# Patient Record
Sex: Female | Born: 1970 | Race: Black or African American | Hispanic: No | Marital: Married | Smoking: Never smoker
Health system: Southern US, Community
[De-identification: ages and names within clinical notes are randomized; demographics above are authoritative.]

## PROBLEM LIST (undated history)

## (undated) DIAGNOSIS — I10 Essential (primary) hypertension: Secondary | ICD-10-CM

## (undated) DIAGNOSIS — E739 Lactose intolerance, unspecified: Secondary | ICD-10-CM

## (undated) DIAGNOSIS — M549 Dorsalgia, unspecified: Secondary | ICD-10-CM

## (undated) DIAGNOSIS — F988 Other specified behavioral and emotional disorders with onset usually occurring in childhood and adolescence: Secondary | ICD-10-CM

## (undated) DIAGNOSIS — M7070 Other bursitis of hip, unspecified hip: Secondary | ICD-10-CM

## (undated) DIAGNOSIS — D509 Iron deficiency anemia, unspecified: Secondary | ICD-10-CM

## (undated) DIAGNOSIS — Z91018 Allergy to other foods: Secondary | ICD-10-CM

## (undated) DIAGNOSIS — R600 Localized edema: Secondary | ICD-10-CM

## (undated) DIAGNOSIS — K829 Disease of gallbladder, unspecified: Secondary | ICD-10-CM

## (undated) DIAGNOSIS — J302 Other seasonal allergic rhinitis: Secondary | ICD-10-CM

## (undated) DIAGNOSIS — E559 Vitamin D deficiency, unspecified: Secondary | ICD-10-CM

## (undated) HISTORY — DX: Disease of gallbladder, unspecified: K82.9

## (undated) HISTORY — DX: Vitamin D deficiency, unspecified: E55.9

## (undated) HISTORY — DX: Lactose intolerance, unspecified: E73.9

## (undated) HISTORY — DX: Localized edema: R60.0

## (undated) HISTORY — DX: Dorsalgia, unspecified: M54.9

## (undated) HISTORY — DX: Other specified behavioral and emotional disorders with onset usually occurring in childhood and adolescence: F98.8

## (undated) HISTORY — DX: Other seasonal allergic rhinitis: J30.2

## (undated) HISTORY — DX: Other bursitis of hip, unspecified hip: M70.70

## (undated) HISTORY — PX: APPENDECTOMY: SHX54

## (undated) HISTORY — DX: Allergy to other foods: Z91.018

---

## 1996-07-18 HISTORY — PX: ECTOPIC PREGNANCY SURGERY: SHX613

## 1997-09-26 ENCOUNTER — Ambulatory Visit (HOSPITAL_COMMUNITY): Admission: RE | Admit: 1997-09-26 | Discharge: 1997-09-26 | Payer: Self-pay | Admitting: Obstetrics and Gynecology

## 1997-10-08 ENCOUNTER — Other Ambulatory Visit: Admission: RE | Admit: 1997-10-08 | Discharge: 1997-10-08 | Payer: Self-pay | Admitting: Obstetrics and Gynecology

## 1997-10-31 ENCOUNTER — Other Ambulatory Visit: Admission: RE | Admit: 1997-10-31 | Discharge: 1997-10-31 | Payer: Self-pay | Admitting: Obstetrics and Gynecology

## 1997-11-22 ENCOUNTER — Inpatient Hospital Stay (HOSPITAL_COMMUNITY): Admission: AD | Admit: 1997-11-22 | Discharge: 1997-11-24 | Payer: Self-pay | Admitting: Obstetrics & Gynecology

## 1998-01-04 ENCOUNTER — Emergency Department (HOSPITAL_COMMUNITY): Admission: EM | Admit: 1998-01-04 | Discharge: 1998-01-04 | Payer: Self-pay | Admitting: Emergency Medicine

## 1998-11-19 ENCOUNTER — Other Ambulatory Visit: Admission: RE | Admit: 1998-11-19 | Discharge: 1998-11-19 | Payer: Self-pay | Admitting: Obstetrics and Gynecology

## 2000-05-18 ENCOUNTER — Other Ambulatory Visit: Admission: RE | Admit: 2000-05-18 | Discharge: 2000-05-18 | Payer: Self-pay | Admitting: Obstetrics and Gynecology

## 2000-09-05 ENCOUNTER — Observation Stay (HOSPITAL_COMMUNITY): Admission: AD | Admit: 2000-09-05 | Discharge: 2000-09-06 | Payer: Self-pay | Admitting: Obstetrics and Gynecology

## 2000-09-06 ENCOUNTER — Encounter: Payer: Self-pay | Admitting: Obstetrics and Gynecology

## 2000-09-20 ENCOUNTER — Emergency Department (HOSPITAL_COMMUNITY): Admission: EM | Admit: 2000-09-20 | Discharge: 2000-09-20 | Payer: Self-pay | Admitting: Emergency Medicine

## 2000-09-27 ENCOUNTER — Encounter: Admission: RE | Admit: 2000-09-27 | Discharge: 2000-10-24 | Payer: Self-pay | Admitting: Orthopedic Surgery

## 2000-11-30 ENCOUNTER — Inpatient Hospital Stay (HOSPITAL_COMMUNITY): Admission: AD | Admit: 2000-11-30 | Discharge: 2000-12-03 | Payer: Self-pay | Admitting: Obstetrics and Gynecology

## 2001-01-31 ENCOUNTER — Encounter: Admission: RE | Admit: 2001-01-31 | Discharge: 2001-02-14 | Payer: Self-pay | Admitting: Orthopedic Surgery

## 2001-02-15 ENCOUNTER — Encounter: Admission: RE | Admit: 2001-02-15 | Discharge: 2001-03-13 | Payer: Self-pay | Admitting: Orthopedic Surgery

## 2002-05-01 ENCOUNTER — Other Ambulatory Visit: Admission: RE | Admit: 2002-05-01 | Discharge: 2002-05-01 | Payer: Self-pay | Admitting: Obstetrics and Gynecology

## 2003-06-12 ENCOUNTER — Inpatient Hospital Stay (HOSPITAL_COMMUNITY): Admission: AD | Admit: 2003-06-12 | Discharge: 2003-06-12 | Payer: Self-pay | Admitting: Obstetrics and Gynecology

## 2004-06-15 ENCOUNTER — Ambulatory Visit (HOSPITAL_COMMUNITY): Admission: RE | Admit: 2004-06-15 | Discharge: 2004-06-15 | Payer: Self-pay | Admitting: Obstetrics and Gynecology

## 2004-06-21 ENCOUNTER — Ambulatory Visit: Payer: Self-pay | Admitting: Internal Medicine

## 2004-09-21 ENCOUNTER — Encounter: Admission: RE | Admit: 2004-09-21 | Discharge: 2004-12-20 | Payer: Self-pay | Admitting: Obstetrics and Gynecology

## 2005-01-28 ENCOUNTER — Inpatient Hospital Stay (HOSPITAL_COMMUNITY): Admission: AD | Admit: 2005-01-28 | Discharge: 2005-01-28 | Payer: Self-pay | Admitting: Obstetrics and Gynecology

## 2005-01-31 ENCOUNTER — Inpatient Hospital Stay (HOSPITAL_COMMUNITY): Admission: AD | Admit: 2005-01-31 | Discharge: 2005-01-31 | Payer: Self-pay | Admitting: Obstetrics & Gynecology

## 2005-02-04 ENCOUNTER — Inpatient Hospital Stay (HOSPITAL_COMMUNITY): Admission: AD | Admit: 2005-02-04 | Discharge: 2005-02-06 | Payer: Self-pay | Admitting: Obstetrics and Gynecology

## 2005-07-15 ENCOUNTER — Encounter: Payer: Self-pay | Admitting: Internal Medicine

## 2005-07-15 LAB — CONVERTED CEMR LAB

## 2005-08-30 ENCOUNTER — Ambulatory Visit: Payer: Self-pay | Admitting: Internal Medicine

## 2006-04-24 ENCOUNTER — Inpatient Hospital Stay (HOSPITAL_COMMUNITY): Admission: AD | Admit: 2006-04-24 | Discharge: 2006-04-24 | Payer: Self-pay | Admitting: Obstetrics and Gynecology

## 2006-05-25 ENCOUNTER — Encounter: Payer: Self-pay | Admitting: Obstetrics and Gynecology

## 2006-05-25 ENCOUNTER — Inpatient Hospital Stay (HOSPITAL_COMMUNITY): Admission: AD | Admit: 2006-05-25 | Discharge: 2006-05-25 | Payer: Self-pay | Admitting: Obstetrics and Gynecology

## 2006-07-21 ENCOUNTER — Inpatient Hospital Stay (HOSPITAL_COMMUNITY): Admission: AD | Admit: 2006-07-21 | Discharge: 2006-07-23 | Payer: Self-pay | Admitting: Obstetrics and Gynecology

## 2006-08-02 ENCOUNTER — Inpatient Hospital Stay (HOSPITAL_COMMUNITY): Admission: AD | Admit: 2006-08-02 | Discharge: 2006-08-02 | Payer: Self-pay | Admitting: Obstetrics & Gynecology

## 2006-08-09 ENCOUNTER — Inpatient Hospital Stay (HOSPITAL_COMMUNITY): Admission: AD | Admit: 2006-08-09 | Discharge: 2006-08-09 | Payer: Self-pay | Admitting: Obstetrics and Gynecology

## 2006-08-11 ENCOUNTER — Inpatient Hospital Stay (HOSPITAL_COMMUNITY): Admission: RE | Admit: 2006-08-11 | Discharge: 2006-08-13 | Payer: Self-pay | Admitting: Obstetrics and Gynecology

## 2006-08-20 ENCOUNTER — Inpatient Hospital Stay (HOSPITAL_COMMUNITY): Admission: AD | Admit: 2006-08-20 | Discharge: 2006-08-23 | Payer: Self-pay | Admitting: Obstetrics and Gynecology

## 2006-08-24 ENCOUNTER — Ambulatory Visit: Payer: Self-pay | Admitting: Internal Medicine

## 2006-09-06 ENCOUNTER — Ambulatory Visit: Payer: Self-pay | Admitting: Internal Medicine

## 2006-09-20 ENCOUNTER — Encounter: Admission: RE | Admit: 2006-09-20 | Discharge: 2006-09-20 | Payer: Self-pay | Admitting: Internal Medicine

## 2006-10-04 ENCOUNTER — Ambulatory Visit: Payer: Self-pay | Admitting: Internal Medicine

## 2007-02-15 ENCOUNTER — Encounter: Payer: Self-pay | Admitting: Internal Medicine

## 2007-02-15 DIAGNOSIS — R519 Headache, unspecified: Secondary | ICD-10-CM | POA: Insufficient documentation

## 2007-02-15 DIAGNOSIS — I10 Essential (primary) hypertension: Secondary | ICD-10-CM

## 2007-02-15 DIAGNOSIS — R51 Headache: Secondary | ICD-10-CM

## 2007-07-19 HISTORY — PX: TUBAL LIGATION: SHX77

## 2007-08-03 ENCOUNTER — Inpatient Hospital Stay (HOSPITAL_COMMUNITY): Admission: AD | Admit: 2007-08-03 | Discharge: 2007-08-04 | Payer: Self-pay | Admitting: Obstetrics and Gynecology

## 2007-08-24 ENCOUNTER — Ambulatory Visit (HOSPITAL_COMMUNITY): Admission: RE | Admit: 2007-08-24 | Discharge: 2007-08-24 | Payer: Self-pay | Admitting: Obstetrics and Gynecology

## 2007-11-17 ENCOUNTER — Ambulatory Visit (HOSPITAL_COMMUNITY): Admission: RE | Admit: 2007-11-17 | Discharge: 2007-11-17 | Payer: Self-pay | Admitting: Obstetrics and Gynecology

## 2008-01-21 ENCOUNTER — Inpatient Hospital Stay (HOSPITAL_COMMUNITY): Admission: AD | Admit: 2008-01-21 | Discharge: 2008-01-24 | Payer: Self-pay | Admitting: *Deleted

## 2008-01-22 ENCOUNTER — Encounter (INDEPENDENT_AMBULATORY_CARE_PROVIDER_SITE_OTHER): Payer: Self-pay | Admitting: Obstetrics and Gynecology

## 2008-05-21 ENCOUNTER — Ambulatory Visit: Payer: Self-pay | Admitting: Internal Medicine

## 2008-08-09 ENCOUNTER — Ambulatory Visit: Payer: Self-pay | Admitting: Family Medicine

## 2008-08-09 DIAGNOSIS — J1189 Influenza due to unidentified influenza virus with other manifestations: Secondary | ICD-10-CM

## 2008-09-03 ENCOUNTER — Encounter (INDEPENDENT_AMBULATORY_CARE_PROVIDER_SITE_OTHER): Payer: Self-pay | Admitting: *Deleted

## 2009-02-05 ENCOUNTER — Telehealth: Payer: Self-pay | Admitting: Family Medicine

## 2009-02-12 ENCOUNTER — Telehealth: Payer: Self-pay | Admitting: Internal Medicine

## 2009-02-12 ENCOUNTER — Ambulatory Visit: Payer: Self-pay | Admitting: Internal Medicine

## 2009-02-12 DIAGNOSIS — M722 Plantar fascial fibromatosis: Secondary | ICD-10-CM

## 2009-03-11 LAB — CONVERTED CEMR LAB
BUN: 12 mg/dL (ref 6–23)
Basophils Absolute: 0 10*3/uL (ref 0.0–0.1)
CO2: 25 meq/L (ref 19–32)
Eosinophils Absolute: 0.1 10*3/uL (ref 0.0–0.7)
HCT: 34.7 % — ABNORMAL LOW (ref 36.0–46.0)
Monocytes Relative: 9 % (ref 3–12)
Neutrophils Relative %: 45 % (ref 43–77)
Platelets: 232 10*3/uL (ref 150–400)
Sodium: 140 meq/L (ref 135–145)
TSH: 1.026 microintl units/mL (ref 0.350–4.500)

## 2009-05-28 ENCOUNTER — Ambulatory Visit: Payer: Self-pay | Admitting: Internal Medicine

## 2009-05-28 LAB — CONVERTED CEMR LAB
BUN: 9 mg/dL (ref 6–23)
Basophils Absolute: 0 10*3/uL (ref 0.0–0.1)
CO2: 31 meq/L (ref 19–32)
Chloride: 102 meq/L (ref 96–112)
Creatinine, Ser: 0.9 mg/dL (ref 0.4–1.2)
Eosinophils Absolute: 0.1 10*3/uL (ref 0.0–0.7)
Eosinophils Relative: 3 % (ref 0.0–5.0)
GFR calc non Af Amer: 90.36 mL/min (ref >60)
Glucose, Bld: 58 mg/dL — ABNORMAL LOW (ref 70–99)
HCT: 36.2 % (ref 36.0–46.0)
HDL: 48.8 mg/dL (ref >39.00)
Hemoglobin: 12 g/dL (ref 12.0–15.0)
LDL Cholesterol: 104 mg/dL — ABNORMAL HIGH (ref 0–99)
Lymphs Abs: 2 10*3/uL (ref 0.7–4.0)
MCV: 92.7 fL (ref 78.0–100.0)
Monocytes Absolute: 0.3 10*3/uL (ref 0.1–1.0)
Platelets: 241 10*3/uL (ref 150.0–400.0)
Potassium: 2.9 meq/L — ABNORMAL LOW (ref 3.5–5.1)
RDW: 13.5 % (ref 11.5–14.6)
Sodium: 141 meq/L (ref 135–145)
Total CHOL/HDL Ratio: 3
Triglycerides: 63 mg/dL (ref 0.0–149.0)
WBC: 4.5 10*3/uL (ref 4.5–10.5)

## 2009-05-31 ENCOUNTER — Telehealth: Payer: Self-pay | Admitting: Internal Medicine

## 2009-06-03 ENCOUNTER — Ambulatory Visit: Payer: Self-pay | Admitting: Radiology

## 2009-06-03 ENCOUNTER — Ambulatory Visit: Payer: Self-pay | Admitting: Internal Medicine

## 2009-06-03 ENCOUNTER — Telehealth: Payer: Self-pay | Admitting: Internal Medicine

## 2009-06-03 ENCOUNTER — Ambulatory Visit (HOSPITAL_BASED_OUTPATIENT_CLINIC_OR_DEPARTMENT_OTHER): Admission: RE | Admit: 2009-06-03 | Discharge: 2009-06-03 | Payer: Self-pay | Admitting: Internal Medicine

## 2009-06-03 DIAGNOSIS — M549 Dorsalgia, unspecified: Secondary | ICD-10-CM | POA: Insufficient documentation

## 2009-06-03 DIAGNOSIS — E876 Hypokalemia: Secondary | ICD-10-CM | POA: Insufficient documentation

## 2009-06-24 ENCOUNTER — Telehealth: Payer: Self-pay | Admitting: Internal Medicine

## 2009-07-29 ENCOUNTER — Telehealth: Payer: Self-pay | Admitting: Internal Medicine

## 2009-07-29 ENCOUNTER — Ambulatory Visit: Payer: Self-pay | Admitting: Internal Medicine

## 2009-07-29 DIAGNOSIS — E663 Overweight: Secondary | ICD-10-CM | POA: Insufficient documentation

## 2009-07-29 DIAGNOSIS — R358 Other polyuria: Secondary | ICD-10-CM

## 2009-07-29 LAB — CONVERTED CEMR LAB
BUN: 11 mg/dL (ref 6–23)
Blood Glucose, Fingerstick: 83
Blood in Urine, dipstick: NEGATIVE
Chloride: 105 meq/L (ref 96–112)
Creatinine, Ser: 0.73 mg/dL (ref 0.40–1.20)
Glucose, Bld: 76 mg/dL (ref 70–99)
Osmolality: 291 mOsm/kg (ref 275–300)
Protein, U semiquant: NEGATIVE
Sodium: 138 meq/L (ref 135–145)
pH: 6.5

## 2009-07-30 ENCOUNTER — Encounter: Payer: Self-pay | Admitting: Internal Medicine

## 2009-07-30 ENCOUNTER — Telehealth: Payer: Self-pay | Admitting: Internal Medicine

## 2009-08-14 ENCOUNTER — Telehealth: Payer: Self-pay | Admitting: Internal Medicine

## 2009-09-02 ENCOUNTER — Encounter: Payer: Self-pay | Admitting: Internal Medicine

## 2009-09-04 ENCOUNTER — Encounter: Payer: Self-pay | Admitting: Internal Medicine

## 2009-09-17 ENCOUNTER — Encounter: Payer: Self-pay | Admitting: Internal Medicine

## 2009-09-22 ENCOUNTER — Encounter: Admission: RE | Admit: 2009-09-22 | Discharge: 2009-09-22 | Payer: Self-pay | Admitting: Nephrology

## 2009-10-07 ENCOUNTER — Encounter: Payer: Self-pay | Admitting: Internal Medicine

## 2009-12-15 ENCOUNTER — Encounter: Admission: RE | Admit: 2009-12-15 | Discharge: 2009-12-15 | Payer: Self-pay | Admitting: Nephrology

## 2009-12-18 ENCOUNTER — Encounter: Payer: Self-pay | Admitting: Internal Medicine

## 2010-04-29 ENCOUNTER — Encounter: Payer: Self-pay | Admitting: Internal Medicine

## 2010-08-19 NOTE — Progress Notes (Signed)
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  I suggest referral to nephrologist Initial call taken by: D. Thomos Lemons DO,  July 30, 2009 6:13 PM  Follow-up for Phone Call        Left message with daughter to return call        Referral request fax to [facility] Follow-up by: Darral Dash,  July 31, 2009 10:48 AM  Additional Follow-up for Phone Call Additional follow up Details #1::        Spoke with pt . wants to talk with Nurse. please call  [phone]    pt also need rx for Bystolic 5mg  call to pharmacy   Additional Follow-up by: Darral Dash,  August 12, 2009 9:27 AM    Additional Follow-up for Phone Call Additional follow up Details #2::    attempted to contact patient at [phone], no answer voice message left informing patient rx has been sent to pharmacy Follow-up by: Glendell Docker CMA,  August 12, 2009 4:05 PM  Prescriptions: BYSTOLIC 5 MG TABS (NEBIVOLOL HCL) one by mouth once daily  #30 x 5   Entered by:   Glendell Docker CMA   Authorized by:   D. Thomos Lemons DO   Signed by:   Glendell Docker CMA on 08/12/2009   Method used:   Electronically to        [facility] [id number] (retail)       [address] [place], [place]  [address]       Ph: [phone]       Fax: [phone]   RxID:   [id number]

## 2010-08-19 NOTE — Assessment & Plan Note (Signed)
Summary: 2 mon f/u/hea   Vital Signs:  Patient profile:   40 year old female Weight:      145.50 pounds BMI:     27.59 O2 Sat:      100 % on Room air Temp:     98.1 degrees F oral Pulse rate:   65 / minute Pulse rhythm:   regular Resp:     16 per minute BP sitting:   130 / 90  (right arm) Cuff size:   regular  Vitals Entered By: Glendell Docker CMA (July 29, 2009 10:34 AM)  O2 Flow:  Room air  Primary Care Provider:  D. Thomos Lemons DO  CC:  2 Month follow up .  History of Present Illness: 2 Month follow up  c/o frequent urination for several days no dysuria,  no unusual odor to urine no vaginal symptoms.  denies excessive thirst.  previous BMET shows slight hypoglycemia and low K.  Allergies (verified): No Known Drug Allergies  Past History:  Past Medical History: Hypertension Headache       Family History: Hypertension     Social History: Married 6 children Never Smoked  Alcohol use-no    Review of Systems       she is concerned about wt gain.  she is eating healthy but not restricting calories.  Physical Exam  General:  alert, well-developed, and well-nourished.   Lungs:  normal respiratory effort and normal breath sounds.   Heart:  normal rate, regular rhythm, and no gallop.   Abdomen:  soft.  mild suprapubic tenderness Extremities:  No lower extremity edema    Impression & Recommendations:  Problem # 1:  POLYURIA (ION-629.52) 40 y/o AA with complaint of polyuria.  urine is neg but she has mild bladder discomfort with palpation.  I suspect early UTI.  empiric abx. blood sugar normal.  rule out diabetes insipidus  Orders: T-Basic Metabolic Panel [phone]) T- * Misc. Laboratory test [phone]) T- * Misc. Laboratory test [phone]) T-Culture, Urine ([phone]) Glucose, (CBG) ([phone])  Problem # 2:  HYPERTENSION (ICD-401.9) stable.  she has hypokalemia.  consider hyperaldosteronism.  repeat labs.  If persistent abnormality - refer to  nephrologist Her updated medication list for this problem includes:    Bystolic 5 Mg Tabs (Nebivolol hcl) ..... One by mouth once daily  BP today: 130/90 Prior BP: 110/80 (06/03/2009)  Labs Reviewed: K+: 2.9 (05/28/2009) Creat: : 0.9 (05/28/2009)   Chol: 165 (05/28/2009)   HDL: 48.80 (05/28/2009)   LDL: 104 (05/28/2009)   TG: 63.0 (05/28/2009)  Problem # 3:  OVERWEIGHT (ICD-278.02) discussed wt loss strategis.  limit calorie intake to 1200-1400 cal per day.  follow low carb diet.  previous thyroid studies normal  Ht: 61 (02/12/2009)   Wt: 145.50 (07/29/2009)   BMI: 27.59 (07/29/2009)  Complete Medication List: 1)  Bystolic 5 Mg Tabs (Nebivolol hcl) .... One by mouth once daily 2)  Cefuroxime Axetil 500 Mg Tabs (Cefuroxime axetil) .... One by mouth two times a day  Patient Instructions: 1)  Call our office if your symptoms do not  improve or gets worse. Prescriptions: CEFUROXIME AXETIL 500 MG TABS (CEFUROXIME AXETIL) one by mouth two times a day  #10 x 0   Entered and Authorized by:   D. Thomos Lemons DO   Signed by:   D. Thomos Lemons DO on 07/29/2009   Method used:   Electronically to        [facility] [id number] (retail)  [address]       [place], [place]  [address]       Ph: [phone]       Fax: [phone]   RxID:   [id number]   Current Allergies (reviewed today): No known allergies   Laboratory Results   Urine Tests    Routine Urinalysis   Color: yellow Appearance: Clear Glucose: negative   (Normal Range: Negative) Bilirubin: negative   (Normal Range: Negative) Ketone: negative   (Normal Range: Negative) Spec. Gravity: <1.005   (Normal Range: 1.003-1.035) Blood: negative   (Normal Range: Negative) pH: 6.5   (Normal Range: 5.0-8.0) Protein: negative   (Normal Range: Negative) Urobilinogen: 0.2   (Normal Range: 0-1) Nitrite: negative   (Normal Range: Negative) Leukocyte Esterace: negative   (Normal Range: Negative)      Blood Tests     CBG Random:: 83mg /dL

## 2010-08-19 NOTE — Letter (Signed)
Summary: Triage Call Report/Call a Nurse  Triage Call Report/Call a Nurse   Imported By: Lanelle Bal 08/03/2009 12:45:21  _____________________________________________________________________  External Attachment:    Type:   Image     Comment:   External Document

## 2010-08-19 NOTE — Consult Note (Signed)
Summary: [facility]  [facility]   Imported By: Lanelle Bal 09/30/2009 12:33:10  _____________________________________________________________________  External Attachment:    Type:   Image     Comment:   External Document

## 2010-08-19 NOTE — Progress Notes (Signed)
Summary: wants to talk to Dr. Fonnie Birkenhead: Abnormal Urine  Phone Note Call from Patient   Caller: Patient Details for Reason: pt. has questions [place]:HCWCBJSE labs Summary of Call: Pt. is waiting to hear back from doctor or nurse Re: abnormal labs. She has some questions. Call back [phone].Michaelle Copas  August 14, 2009 3:28 PM   Follow-up for Phone Call        [phone] cell phone - please call pt on monday to arrange referral to nephrologist Follow-up by: D. Thomos Lemons DO,  August 14, 2009 5:01 PM  Additional Follow-up for Phone Call Additional follow up Details #1::        Left message for pt to return call    Additional Follow-up by: Darral Dash,  August 18, 2009 2:21 PM    Additional Follow-up for Phone Call Additional follow up Details #2::    Spoke with patient   referral sent to [facility]   Jan 14    waiting to hear back for them  pt informed Follow-up by: Darral Dash,  August 19, 2009 8:48 AM

## 2010-08-19 NOTE — Letter (Signed)
Summary: [facility]  [facility]   Imported By: Maryln Gottron 05/14/2010 14:40:07  _____________________________________________________________________  External Attachment:    Type:   Image     Comment:   External Document

## 2010-08-19 NOTE — Letter (Signed)
Summary: [facility]  [facility]   Imported By: Lanelle Bal 10/23/2009 08:50:33  _____________________________________________________________________  External Attachment:    Type:   Image     Comment:   External Document

## 2010-08-19 NOTE — Letter (Signed)
Summary: [facility]  [facility]   Imported By: Lanelle Bal 01/06/2010 10:50:44  _____________________________________________________________________  External Attachment:    Type:   Image     Comment:   External Document
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
## 2010-11-30 NOTE — Op Note (Signed)
Lindsay [name], Lindsay [name]            ACCOUNT NO.:  [id number]   MEDICAL RECORD NO.:  [id number]          PATIENT TYPE:  INP   LOCATION:  9125                          [facility]ACILITY:  [facility]   PHYSICIAN:  Maxie Better, M.D.DATE OF BIRTH:  13-Apr-1971   DATE OF PROCEDURE:  01/22/2008  DATE OF DISCHARGE:                               OPERATIVE REPORT   PREOPERATIVE DIAGNOSIS:  Desires sterilization.   PROCEDURE:  1. Minilaparotomy.  2. Postpartum tubal ligation via right partial salpingectomy.   POSTOPERATIVE DIAGNOSES:  1. Desires sterilization.  2. Surgical absence of left fallopian tube.   ANESTHESIA:  Epidural.   SURGEON:  Maxie Better, MD   INDICATIONS:  This is a 39 year old gravida 8, para 6 female status post  uncomplicated vaginal delivery on January 22, 2008, who desires permanent  sterilization.  This last pregnancy was secondary to a failed vasectomy.  The patient has a history of an ectopic pregnancy with reportedly  presence of the left fallopian tube and the operative report was  unavailable since the surgery had been somewhere between 1997 and 1998.  Risk and benefit of the procedure and plan had been explained to the  patient.  Consent was signed and the patient was transferred to the  operating room.   PROCEDURE:  Under adequate epidural anesthesia, the patient was placed  in the supine position.  An indwelling Foley catheter was not in place.  The abdomen was sterilely prepped and draped in usual fashion.  Marcaine  0.25% was injected along the previous infraumbilical incision.  The  infraumbilical incision was carried down to the rectus fascia which was  then opened and extended.  The uterus was at the umbilicus.  Left side  was inspected.  The round ligaments were identified down to its  insertion into the left lower quadrant.  The left ovary was brought up  into the field and fallopian tube was not identified, was not palpated,  several attempts and  inspection did not reveal the presence of the tube.  The patient was notified of that intraoperatively.  Attention was then  turned to the right side with normal palpable right ovary and the right  fallopian tube was easily identified and brought up to its fimbriated  end.  Midportion of the  tube was grasped with a Babcock.  The  underlying mesosalpinx was opened with cautery and the proximal distal  portion was tied with 0 chromic suture x2 proximally and distally and  the intervening segment was then removed.  The fallopian tubes brought  back into the abdomen.  Bleeding from the left ovary in its manipulation  and try to look for the left fallopian tube and this was either  hemostased with sutures and/or clamped and free tied with 3-0 Vicryl  suture with good hemostasis noted.  When the procedure was felt to be  complete, the  fascia was closed with 0 Vicryl.  The skin approximated with 4-0 Vicryl  subcuticular stitch.  Specimen of this portion of the right fallopian  tube was sent to pathology.  Estimated blood loss was minimal.  Complication was none.  The  patient tolerated the procedure well and was  transferred to recovery in stable condition.      Maxie Better, M.D.  Electronically Signed     [place]/MEDQ  D:  01/22/2008  T:  01/23/2008  Job:  [id number]

## 2010-12-03 NOTE — Consult Note (Signed)
Lindsay [name], Lindsay [name]            ACCOUNT NO.:  [id number]   MEDICAL RECORD NO.:  [id number]          PATIENT TYPE:  MAT   LOCATION:  MATC                          [facility]ILITY:  [facility]   PHYSICIAN:  Richardean Sale, M.D.   DATE OF BIRTH:  [birth year]   DATE OF CONSULTATION:  08/20/2006  DATE OF DISCHARGE:                                 CONSULTATION   CHIEF COMPLAINT:  Headache.   HISTORY OF PRESENT ILLNESS:  This is a 40 year old gravida 7, para 5-0-2-  5 African American female, who is status post vaginal delivery on  August 11, 2006, who has been on oral labetalol since delivery for  control of hypertension.  The patient says she does have a history of  hypertension in the past that was treated with medication, but improved  and she was not on any medicine throughout this pregnancy.  She has been  having a headache over the last couple days that has been behind her  eyes, 5/10 in severity.  Yesterday, she took 800 mg ibuprofen with  improvement in the headache but the headache returned today.  She  complains of some visual changes earlier today but that resolved when  she put on her glasses.  She denies any nausea, vomiting or epigastric  pain.   PAST HISTORY:  Significant for intermittent hypertension not on  medication at time of this pregnancy, vaginal delivery x5, ectopic x1,  SAB x1.  No prior surgeries.  No known drug allergies.   FAMILY HISTORY:  Positive for mom with hypertension.   MEDICATIONS:  Labetalol 100 mg p.o. t.i.d. and Tylenol and ibuprofen  p.r.n.   PHYSICAL EXAM:  She is afebrile.  Vital signs are stable.  Blood  pressure on arrival was 151/104.  After receiving a dose of labetalol,  it was down to 147/79.  GENERAL:  She is a well-developed and well-nourished [place] American  female who is no acute distress.  HEART:  Regular rate and rhythm.  LUNGS:  Clear to auscultation bilaterally.  ABDOMEN:  Is soft, nontender, no epigastric pain.  EXTREMITIES:  No  cyanosis, clubbing or edema, nontender.  Deep tendon  reflexes are 2+ bilaterally with no clonus.   LABORATORY STUDIES:  Urine is negative for protein, LDH 192, uric acid  5.1, ALT 29, AST 22, creatinine 0.8, platelets 369, hemoglobin 1.6.   ASSESSMENT:  A 35 gravida 7, para 5-0-2-5 [place] American female who is  approximately one week status post delivery with hypertension.   PLAN:  1. The patient received additional dose labetalol 100 mg p.o. here at      the hospital and 650 mg of Tylenol after which her headache      resolved completely.  She now denies any headache, visual changes      or epigastric pain and her blood pressure is improved.  Given that      she is now asymptomatic and her preeclampsia labs are normal, we      will send her home on bedrest to continue her labetalol 100 mg p.o.      t.i.d.  She is to return to  the office      tomorrow for a blood pressure check to determine if we may need to      make any adjustments in her blood pressure medications.  She is to      return immediately if her headache recurs and has not improved with      Tylenol, ibuprofen or she develops visual changes or epigastric      pain.      Richardean Sale, M.D.  Electronically Signed     JW/MEDQ  D:  08/20/2006  T:  08/20/2006  Job:  [id number]
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
## 2010-12-03 NOTE — H&P (Signed)
[name], [name]            ACCOUNT NO.:  [id number]   MEDICAL RECORD NO.:  [id number]          PATIENT TYPE:  INP   LOCATION:  9152                          [facility]ILITY:  [facility]   PHYSICIAN:  Lenoard Aden, M.D.DATE OF BIRTH:  16-May-1971   DATE OF ADMISSION:  07/21/2006  DATE OF DISCHARGE:                              HISTORY & PHYSICAL   CHIEF COMPLAINT:  1. IGR.  2. Oligohydramnios.   This is a 40 year old African American female G7, P4,0-2-4, who presents  with estimated fetal weight in the 4th percentile and an AFI of 6 today  for hospitalization and expectant management, IV fluids, and fetal  surveillance.   ALLERGIES:  No known drug allergies.   MEDICATIONS:  Prenatal vitamins and Labetalol.   PAST MEDICAL HISTORY:  She is a nonsmoker, nondrinker.  She denies  domestic or physical violence.  She has a history of pregnancy, 4  vaginal deliveries, a miscarriage, a history of hypertension, previously  an appendectomy.   FAMILY HISTORY:  Cleft palate and hypertension.   Prenatal course is complicated by chronic hypertension on labetalol,  IGR, and oligohydramnios as noted.   PHYSICAL EXAMINATION:  VITAL SIGNS:  Her blood pressure is 110/72.  HEENT:  Normal.  NECK:  Supple with full range of motion.  LUNGS:  Clear to auscultation.  HEART:  Regular rate and rhythm.  ABDOMEN:  Soft, gravid, nontender.  Estimated fetal weight per  ultrasound in the 4th percentile.  PELVIC:  Cervix is 2, 80%, 0.  EXTREMITIES:  No cords.  NEUROLOGIC:  Nonfocal.  SKIN:  Intact.   IMPRESSION:  A 36 week intrauterine pregnancy.  Chronic hypertension with inhibitory growth rate (IGR) and  oligohydramnios.  History of normal Dopplers.   PLAN:  Proceed with hospitalization, IV fluid management, continue her  labetalol, fetal surveillance, possible expectant management and  discharge versus delivery.      Lenoard Aden, M.D.  Electronically Signed     RJT/MEDQ  D:   07/22/2006  T:  07/22/2006  Job:  [id number]

## 2010-12-03 NOTE — H&P (Signed)
[facility]  Patient:    Lindsay [name], Lindsay [name]                     MRN: [id number] Adm. Date:  [id number] Disc. Date: [id number] Attending:  Tobey Bride Dictator:   Vance Gather Duplantis, C.N.M.                         History and Physical  HISTORY OF PRESENT ILLNESS:   Lindsay [name] is a 40 year old married black female, gravida 4, para 2-0-1-2 at [redacted] weeks gestation who presents complaining of uterine contractions every 4 to 5 minutes for the last couple of hours. She denies any nausea, vomiting, leaking, or vaginal bleeding.  She reports positive fetal movement.  Her pregnancy has been followed at [facility] by the certified nurse midwife service and has been essentially uncomplicated though at risk for 1) first trimester spotting, 2) questionable LMP, 3) history of cryosurgery, 4) history of gestational diabetes with a previous pregnancy, 5) family history of polydactyly.  OB-GYN HISTORY:               She is a gravida 4, para 2-0-0-1 ectopic and two live births with a questionable LMP with EDC by ultrasound of 12/07/00.  She delivered a viable female infant in March of 1996 that weighed 6 pounds, 8 ounces at [redacted] weeks gestation following a three-hour labor with no complications.  In May 1997, she had a left ectopic pregnancy and, in May 1999, she delivered a viable female infant who weighed 7 pounds, 14 ounces at [redacted] weeks gestation following a two-hour labor also with no complications, but she did have gestational diabetes with that pregnancy.  GENERAL MEDICAL HISTORY:      She has no known drug allergies.  She reports having had the usual childhood diseases.  She reports a history of Chlamydia in 1993 that has been treated and no subsequent problems.  She has a history of anemia after her delivery in 1996, a history of diabetes with her pregnancy in 1999, and occasional urinary tract infections.  FAMILY HISTORY:               Significant for  a father and sister with chronic hypertension, maternal uncle with cancer of unknown type, and her daughter had polydactyly and multiple other family members with polydactyly.  SOCIAL HISTORY:               She is married to [employer] who is involved and supportive.  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  They are of the [place] faith.  They deny any illicit drug use, alcohol, or smoking with this pregnancy.  PRENATAL LABS:                Her blood type is O positive.  Her antibody screen is negative.  Sickle cell trait is negative.  Syphilis is nonreactive. Rubella is immune.  Hepatitis B surface antigen is negative.  HIV is nonreactive.  Glucola was 87 at 18 weeks and 128 at 28 weeks.  Her GC and Chlamydia were both negative.  Pap was within normal limits, and her 36-week beta strep was negative.  PHYSICAL EXAMINATION:  VITAL SIGNS:                  Stable.  She is afebrile.  HEENT:  Grossly within normal limits.  HEART:                        Regular rhythm and rate.  CHEST:                        Clear.  BREASTS:                      Soft and nontender.  ABDOMEN:                      Gravid with uterine contractions every three to four minutes.  Her fetal heart rate is reactive and reassuring.  PELVIC:                       Exam is 5 to 6 cm, 90% vertex, and intact membranes per the R.N. and maternity admissions.  EXTREMITIES:                  Within normal limits.  ASSESSMENT:                   1. Intrauterine pregnancy at term.                               2. Active labor.                               3. Negative group B strep.  PLAN:                         Admit to labor and delivery, follow routine certified nurse midwife orders, and to notify Dr. Leonard Schwartz of patients admission. DD:  11/30/00 TD:  11/30/00 Job: [id number]

## 2010-12-03 NOTE — Discharge Summary (Signed)
Lindsay [name], Lindsay [name]            ACCOUNT NO.:  [id number]   MEDICAL RECORD NO.:  [id number]          PATIENT TYPE:  INP   LOCATION:  9125                          FACILITY:  [facility]   PHYSICIAN:  Maxie Better, M.D.DATE OF BIRTH:  1971-07-17   DATE OF ADMISSION:  01/21/2008  DATE OF DISCHARGE:  01/24/2008                               DISCHARGE SUMMARY   ADMISSION DIAGNOSES:  1. Term gestation.  2. Active labor, desires sterilization.  3. Chronic hypertension.   DISCHARGE DIAGNOSES:  1. Chronic hypertension.  2. Desires sterilization.  3. Term gestation, delivered.   PROCEDURE:  Postpartum tubal ligation.   HISTORY OF PRESENT ILLNESS:  A 40 year old gravida 8, para 5-0-2-5  female at term with chronic hypertension on labetalol, well controlled,  admitted in active labor.  The patient's husband had had a vasectomy,  which failed.  She has a history of an ectopic pregnancy in May 1998.   HOSPITAL COURSE:  The patient was admitted.  She went into spontaneous  labor and delivered quickly.  The patient desired sterilization.  She  had had reported removal of tube at the time of her ectopic.  The  patient reports that was not true.  The report had not been immediately  available.  After her delivery, the patient was taken to the operating  room for a surgery.  Surgical absent of the left tube was noted.  Right  midportion of fallopian tube was removed.  The patient had delivered a 7-  pound 1 ounce live female with Apgar of 8 and 9.  Postoperatively, the  patient was continued on her labetalol.  By postop day #2, postpartum  day #2, the patient other than complaining of incisional pain from her  tubal ligation was doing well.  Her blood pressures were 153/92, range  of blood pressure was 138-152/88-102.  Her infraumbilical incision had  no erythema or drainage.  She was deemed well to be discharge to home.   DISPOSITION:  Home.   CONDITION:  Stable.   DISCHARGE  MEDICATIONS:  1. Labetalol 200 mg p.o. b.i.d.  2. Vitamins 1 p.o. daily.   DISCHARGE INSTRUCTIONS:  Per the postpartum booklet given.  Followup  appointment for her blood pressure check at [facility] on Monday  and for 6 weeks postpartum.   The CBC on postop day #1, postpartum day #1 was hemoglobin of 12,  hematocrit 35.4, white count of 8.1, and platelet count of 158,000.      Maxie Better, M.D.  Electronically Signed     [place]/MEDQ  D:  02/07/2008  T:  02/07/2008  Job:  [id number]
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
## 2010-12-03 NOTE — Discharge Summary (Signed)
Lindsay [name], Lindsay [name]            ACCOUNT NO.:  [id number]   MEDICAL RECORD NO.:  [id number]          PATIENT TYPE:  INP   LOCATION:  9314                          FACILITY:  [facility]   PHYSICIAN:  Richardean Sale, M.D.   DATE OF BIRTH:  1970/11/14   DATE OF ADMISSION:  08/20/2006  DATE OF DISCHARGE:  08/23/2006                               DISCHARGE SUMMARY   ADMITTING DIAGNOSIS:  Postpartum hypertension.   DISCHARGE DIAGNOSIS:  Postpartum hypertension.   HOSPITAL COURSE/HISTORY OF PRESENT ILLNESS:  Please see admission  history and physical for details.  Briefly, this is a 40 year old  gravida 7, para 5-0-2-5 African American female who is status post  vaginal delivery on January25,2008 and she has been on oral labetalol  since delivery for control of her hypertension.  The patient presented  on August 20, 2006 complaining of a headache behind her eyes; it was  severe and 5/10 pain scale.  She underwent evaluation in Maternity  Admissions that revealed urine negative for protein, liver function  tests normal, platelets 369,000 and no evidence of superimposed  preeclampsia.  The patient's blood pressure initially improved while in  Maternity Admissions after receiving a dose of labetalol and her  headache improved while in Maternity Admissions, but recurred.  Blood  pressure continue to be elevated; she was subsequently admitted for  observation and during hospitalization received antihypertensives as  well as magnesium sulfate intravenously to treat for any possible  superimposed preeclampsia.  After receiving magnesium sulfate, the  patient had excellent diuresis.  She symptomatically improved.  Blood  pressure was better controlled with her oral labetalol.  She was  subsequently discharged to home on hospital day #3 with an appointment  to follow up with the primary care physician the following day.   DISPOSITION:  To home.   CONDITION:  Improved.   FOLLOWUP:  She will follow  up in the next 24 hours with her primary care  physician, who will further monitor her blood pressures.   MEDICATIONS:  1. Labetalol 100 mg p.o. t.i.d.  2. Ibuprofen and Tylenol as needed.   INSTRUCTIONS:  She is to return for worsening headache, visual changes  or epigastric pain.      Richardean Sale, M.D.  Electronically Signed     JW/MEDQ  D:  09/28/2006  T:  09/28/2006  Job:  [id number]

## 2010-12-03 NOTE — Discharge Summary (Signed)
NAMEJOLAINE, Lindsay [name]            ACCOUNT NO.:  [id number]   MEDICAL RECORD NO.:  [id number]          PATIENT TYPE:  INP   LOCATION:  9152                          [facility]  [facility]   PHYSICIAN:  Lenoard Aden, M.D.DATE OF BIRTH:  Mar 21, 1971   DATE OF ADMISSION:  07/21/2006  DATE OF DISCHARGE:  07/23/2006                               DISCHARGE SUMMARY   Patient admitted for oligohydramnios, underwent IV fluid hydration,  normal fetal surveillance noted.  Discharged home day two after  reaccumulation of fluid noted.  Discharge teaching done.   DISCHARGE MEDICATIONS:  Prenatal vitamins and antihypertensives.   FOLLOWUP:  In the office within one week.      Lenoard Aden, M.D.  Electronically Signed     RJT/MEDQ  D:  10/06/2006  T:  10/06/2006  Job:  [id number]

## 2011-04-07 LAB — URINALYSIS, ROUTINE W REFLEX MICROSCOPIC
Bilirubin Urine: NEGATIVE
Glucose, UA: NEGATIVE
Hgb urine dipstick: NEGATIVE
Nitrite: NEGATIVE
Protein, ur: NEGATIVE
Urobilinogen, UA: 0.2

## 2011-04-14 LAB — URINALYSIS, ROUTINE W REFLEX MICROSCOPIC
Bilirubin Urine: NEGATIVE
Hgb urine dipstick: NEGATIVE
Protein, ur: NEGATIVE
Specific Gravity, Urine: 1.02
pH: 6.5

## 2011-04-14 LAB — CBC
HCT: 35.4 — ABNORMAL LOW
MCHC: 33.9
MCV: 94.2
MCV: 94.9
Platelets: 158
Platelets: 185
RDW: 14
WBC: 9.3

## 2011-04-14 LAB — COMPREHENSIVE METABOLIC PANEL
Albumin: 2.5 — ABNORMAL LOW
Alkaline Phosphatase: 83
BUN: 13
CO2: 24
Chloride: 105
GFR calc Af Amer: >60
GFR calc non Af Amer: >60
Potassium: 3.6
Total Protein: 5.9 — ABNORMAL LOW

## 2011-04-14 LAB — RPR: RPR Ser Ql: NONREACTIVE

## 2011-04-14 LAB — LACTATE DEHYDROGENASE: LDH: 162

## 2011-06-30 ENCOUNTER — Emergency Department (HOSPITAL_COMMUNITY)
Admission: EM | Admit: 2011-06-30 | Discharge: 2011-06-30 | Disposition: A | Discharge status: Home / Self Care | Payer: 59 | Source: Home / Self Care | Attending: Emergency Medicine | Admitting: Emergency Medicine

## 2011-06-30 DIAGNOSIS — J111 Influenza due to unidentified influenza virus with other respiratory manifestations: Secondary | ICD-10-CM

## 2011-06-30 HISTORY — DX: Essential (primary) hypertension: I10

## 2011-06-30 MED ORDER — ALBUTEROL SULFATE HFA 108 (90 BASE) MCG/ACT IN AERS: 1.0000 | INHALATION_SPRAY | Freq: Four times a day (QID) | RESPIRATORY_TRACT | Status: DC | PRN

## 2011-06-30 MED ORDER — HYDROCODONE-ACETAMINOPHEN 7.5-500 MG/15ML PO SOLN: 5.0000 mL | Freq: Four times a day (QID) | ORAL | Status: AC | PRN

## 2011-06-30 MED ORDER — FLUTICASONE PROPIONATE 50 MCG/ACT NA SUSP: 2.0000 | Freq: Every day | NASAL | Status: DC

## 2011-06-30 NOTE — ED Provider Notes (Signed)
History     CSN: [id number] Arrival date & time: 06/30/2011  8:52 AM   First MD Initiated Contact with Patient 06/30/11 [phone]      Chief Complaint  Patient presents with  . Influenza    (Consider location/radiation/quality/duration/timing/severity/associated sxs/prior treatment) HPI Comments: HPI : Flu symptoms for about 4 day. Fever to 103 with chills, sweats, myalgias, fatigue, headache. Fever resolved but pt c/o continued cough, malaise, fatigue, HA, nasal congestion. Unable to sleep at night secondary to coughing. . Has decreased appetite, but tolerating liquids by mouth. Is still taking spirinolactone despite being ill. Did not gtr flu shot this year.   Review of Systems: Positive for fatigue, mild nasal congestion, mild sore throat, mild swollen anterior neck glands, mild cough. Negative for acute vision changes, stiff neck, focal weakness, syncope, seizures, respiratory distress, vomiting, diarrhea, GU symptoms.   Patient is a 40 y.o. female presenting with flu symptoms.  Influenza    Past Medical History  Diagnosis Date  . Hypertension     Past Surgical History  Procedure Date  . Tubal ligation     History reviewed. No pertinent family history.  History  Substance Use Topics  . Smoking status: Never Smoker   . Smokeless tobacco: Not on file  . Alcohol Use: No    OB History    Grav Para Term Preterm Abortions TAB SAB Ect Mult Living                  Review of Systems  Allergies  Review of patient's allergies indicates no known allergies.  Home Medications   Current Outpatient Rx  Name Route Sig Dispense Refill  . SPIRONOLACTONE 25 MG PO TABS Oral Take 25 mg by mouth 2 (two) times daily.      . ALBUTEROL SULFATE HFA 108 (90 BASE) MCG/ACT IN AERS Inhalation Inhale 1-2 puffs into the lungs every 6 (six) hours as needed for wheezing. 1 Inhaler 0  . FLUTICASONE PROPIONATE 50 MCG/ACT NA SUSP Nasal Place 2 sprays into the nose daily. 16 g 0  .  HYDROCODONE-ACETAMINOPHEN 7.5-500 MG/15ML PO SOLN Oral Take 5 mLs by mouth every 6 (six) hours as needed for pain. 120 mL 0    BP 126/82  Pulse 64  Temp(Src) 98.6 F (37 C) (Oral)  Resp 18  SpO2 100%  LMP 06/02/2011  Physical Exam  Nursing note and vitals reviewed. Constitutional: She is oriented to person, place, and time. She appears well-developed and well-nourished.       Appears tired  HENT:  Head: Normocephalic and atraumatic.  Right Ear: Tympanic membrane and ear canal normal.  Left Ear: Tympanic membrane and ear canal normal.  Nose: Mucosal edema and rhinorrhea present. No epistaxis.  Mouth/Throat: Uvula is midline and mucous membranes are normal. Posterior oropharyngeal erythema present. No oropharyngeal exudate.       (-) frontal, maxillary sinus tenderness  Eyes: Conjunctivae and EOM are normal. Pupils are equal, round, and reactive to light.  Neck: Normal range of motion. Neck supple.  Cardiovascular: Normal rate, regular rhythm, normal heart sounds and intact distal pulses.   Pulmonary/Chest: Effort normal and breath sounds normal. No respiratory distress. She has no wheezes. She has no rales.  Abdominal: Bowel sounds are normal. She exhibits no distension. There is no tenderness. There is no rebound and no guarding.  Musculoskeletal: Normal range of motion.  Lymphadenopathy:    She has no cervical adenopathy.  Neurological: She is alert and oriented to person, place, and time.  Skin: Skin is warm and dry. No rash noted.  Psychiatric: She has a normal mood and affect. Her behavior is normal. Judgment and thought content normal.    ED Course  Procedures (including critical care time)  Labs Reviewed - No data to display No results found.   1. Influenza       MDM  Pt with most likely post viral cough. Lungs clear satting 100% RA do not suspect secondary PNA at this time.    Luiz Blare, MD 06/30/11 [phone]

## 2011-06-30 NOTE — ED Notes (Signed)
C/o fever, body aches at night, headache, productive cough of yellow sputum and fatigue.  Sx started on Sunday, states no fever since yesterday.

## 2011-08-02 IMAGING — CT CT ABDOMEN W/O CM
2 of 4 series · 11 of 36 positions shown, 18 images · non-contrast
Comparison: None.

CLINICAL DATA: Hyperaldosteronism, evaluate for adrenal lesion

CT ABDOMEN WITHOUT CONTRAST
TECHNIQUE: Multidetector CT imaging of the abdomen was performed
following the standard protocol without IV contrast.

[Series 3: adrenal without · axial · non-contrast · 0.75mm/px · z∈[-237,-37]mm · 10 of 98 slices shown, 16 images]
[im 9/98  soft-tissue]
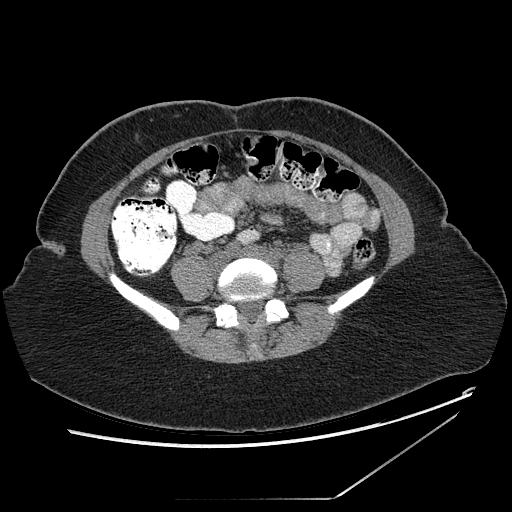
[im 9/98  bone]
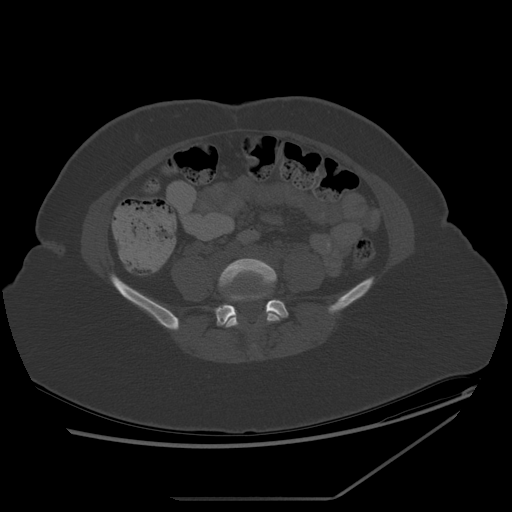
[im 18/98  soft-tissue]
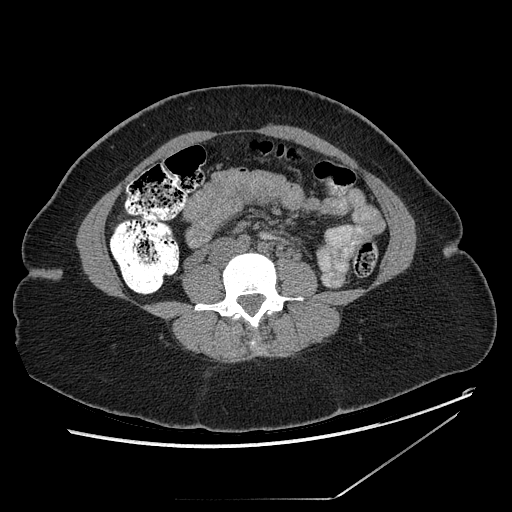
[im 27/98  soft-tissue]
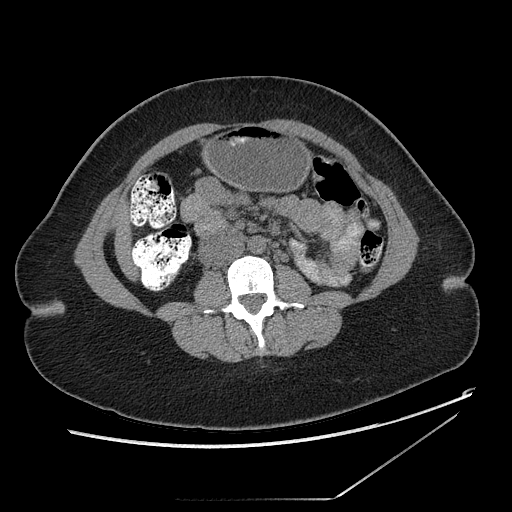
[im 36/98  soft-tissue]
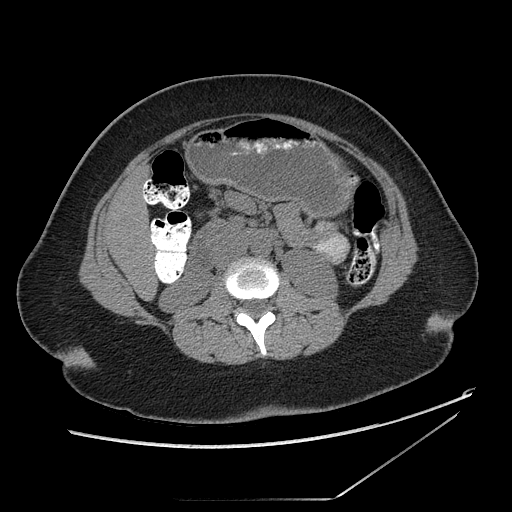
[im 45/98  soft-tissue]
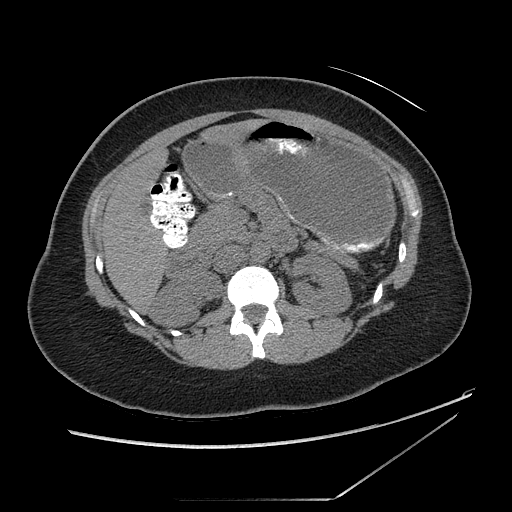
[im 53/98  soft-tissue]
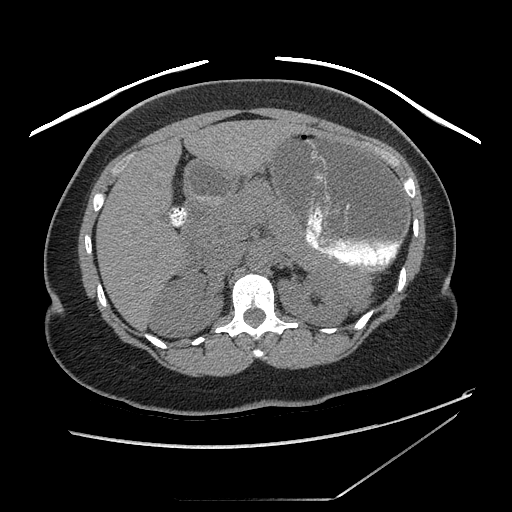
[im 62/98  soft-tissue]
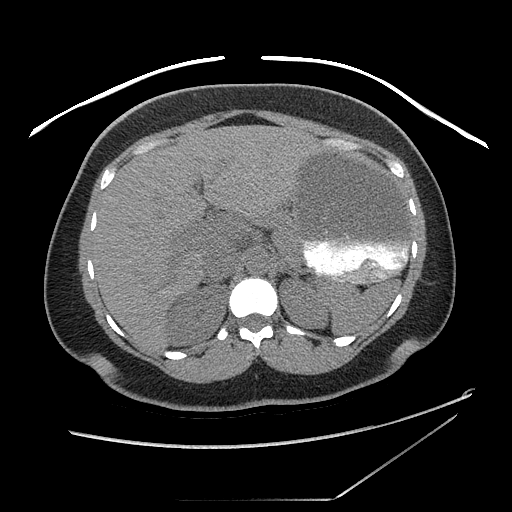
[im 62/98  lung]
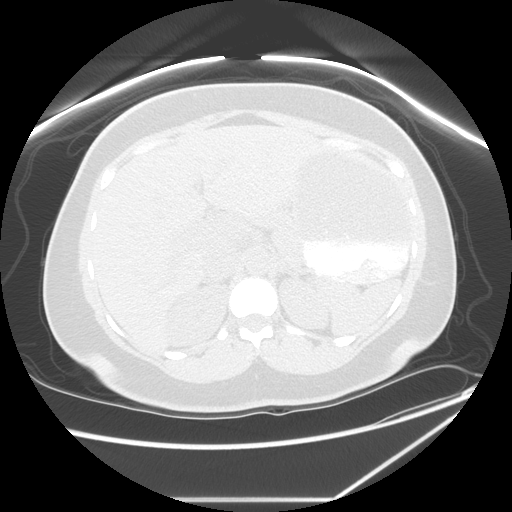
[im 71/98  soft-tissue]
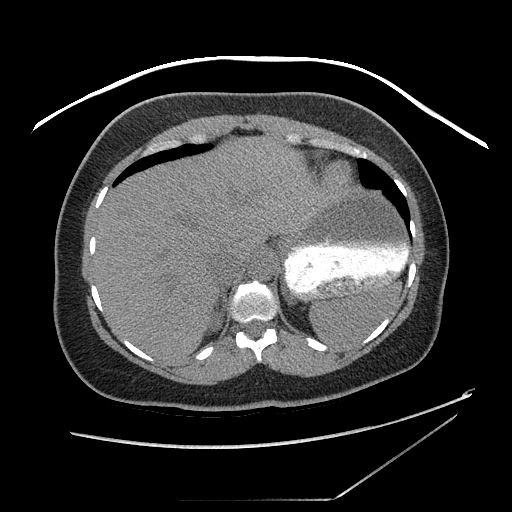
[im 71/98  lung]
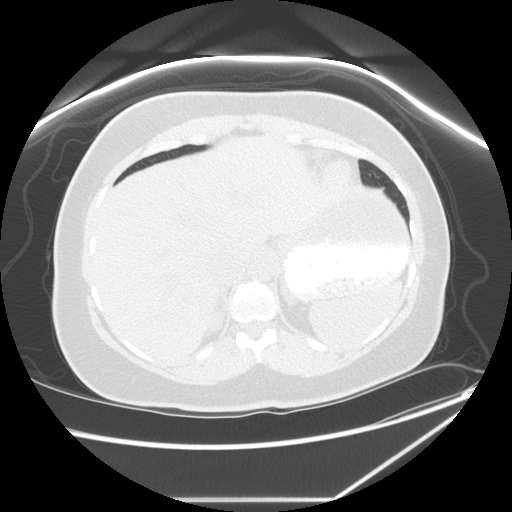
[im 80/98  soft-tissue]
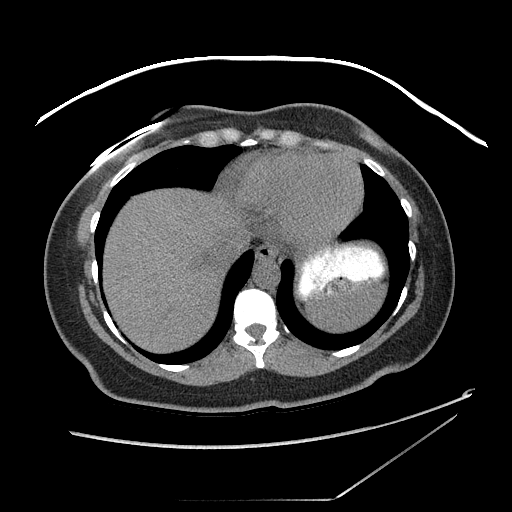
[im 80/98  lung]
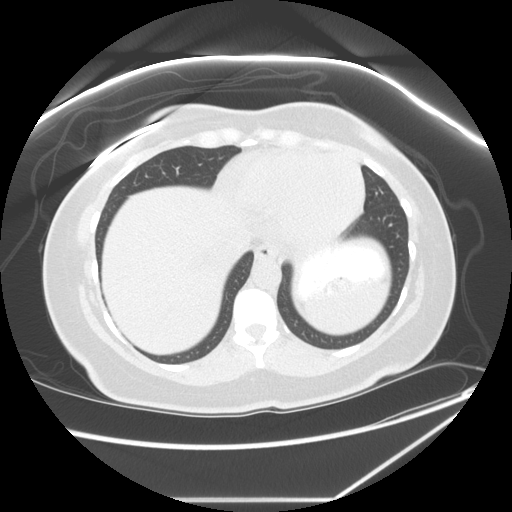
[im 80/98  bone]
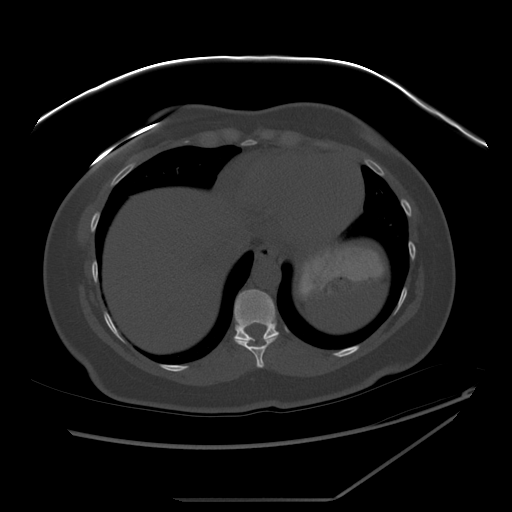
[im 89/98  soft-tissue]
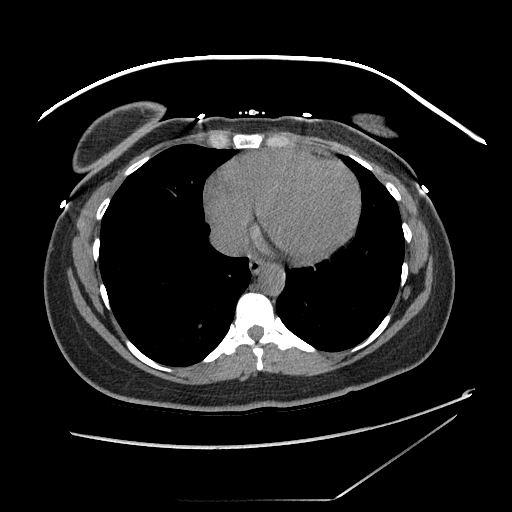
[im 89/98  lung]
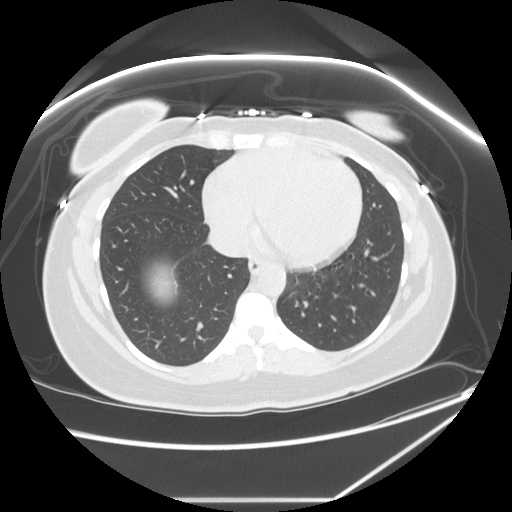

[Series 601: coronal body · coronal · 0.75mm/px · 1 of 111 slices shown, 2 images]
[im 37/111  soft-tissue]
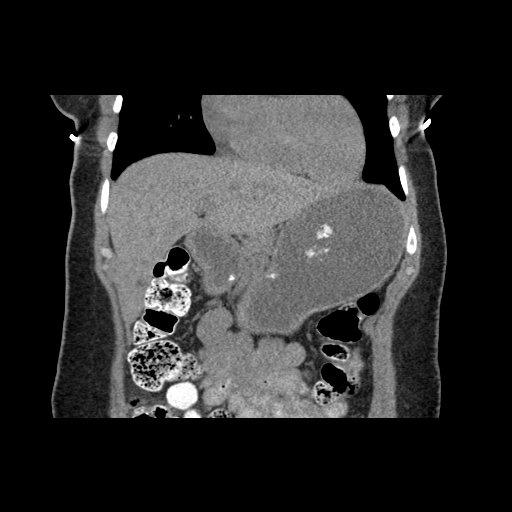
[im 37/111  bone]
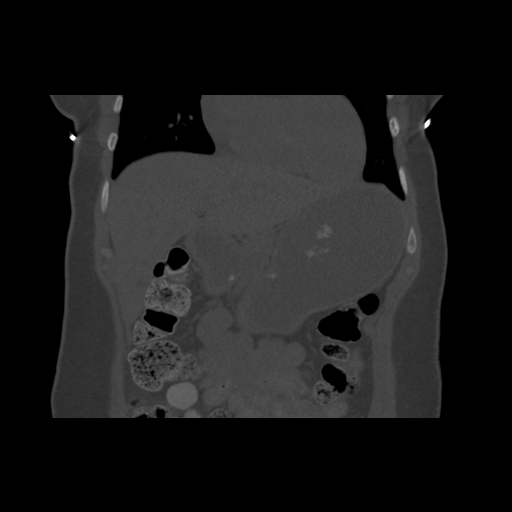

[11 of 36 positions shown; findings below may reference images not displayed]

FINDINGS: The lung bases are clear.  The liver is unremarkable in
the unenhanced state.  The gallbladder is contracted but no
gallstones are seen.  The pancreas is normal in size and the
pancreatic duct is not dilated.  The adrenal glands are normal in
size on this unenhanced study with no evidence of adrenal mass.
The spleen is normal in size.  Small nonobstructing right renal
calculi are present.  The abdominal aorta is normal in caliber.  No
adenopathy is seen.  No bony abnormality is seen.
IMPRESSION: 1.  The adrenal glands are normal in size on this unenhanced study.
No adrenal lesion is seen.
2.  Small nonobstructing right renal calculi.

## 2012-02-22 ENCOUNTER — Telehealth: Payer: Self-pay | Admitting: Internal Medicine

## 2012-02-22 MED ORDER — SPIRONOLACTONE 25 MG PO TABS: 25.0000 mg | ORAL_TABLET | Freq: Two times a day (BID) | ORAL | Status: DC

## 2012-02-22 NOTE — Telephone Encounter (Signed)
Patient called stating that she was being followed by [facility] and they were prescribing her Aldactone and she is no longer seeing them and need a refill sent to [facility] on [address]. Patient has an appt for a cpx on 03/01/12. Please advise and call patient with advise.

## 2012-02-22 NOTE — Telephone Encounter (Signed)
Sent in one month supply to get pt through to appt

## 2012-02-23 ENCOUNTER — Other Ambulatory Visit (INDEPENDENT_AMBULATORY_CARE_PROVIDER_SITE_OTHER): Payer: 59

## 2012-02-23 DIAGNOSIS — Z0000 Encounter for general adult medical examination without abnormal findings: Secondary | ICD-10-CM

## 2012-02-23 LAB — LIPID PANEL: Cholesterol: 140 mg/dL (ref 0–200)

## 2012-03-01 ENCOUNTER — Encounter: Payer: Self-pay | Admitting: Internal Medicine

## 2012-03-01 ENCOUNTER — Ambulatory Visit (INDEPENDENT_AMBULATORY_CARE_PROVIDER_SITE_OTHER): Payer: 59 | Admitting: Internal Medicine

## 2012-03-01 VITALS — BP 122/90 | HR 76 | Temp 98.7°F | Ht 60.25 in | Wt 151.0 lb

## 2012-03-01 DIAGNOSIS — R0989 Other specified symptoms and signs involving the circulatory and respiratory systems: Secondary | ICD-10-CM

## 2012-03-01 DIAGNOSIS — R0683 Snoring: Secondary | ICD-10-CM

## 2012-03-01 DIAGNOSIS — Z1231 Encounter for screening mammogram for malignant neoplasm of breast: Secondary | ICD-10-CM

## 2012-03-01 DIAGNOSIS — Z0000 Encounter for general adult medical examination without abnormal findings: Secondary | ICD-10-CM | POA: Insufficient documentation

## 2012-03-01 DIAGNOSIS — R5382 Chronic fatigue, unspecified: Secondary | ICD-10-CM | POA: Insufficient documentation

## 2012-03-01 DIAGNOSIS — R5383 Other fatigue: Secondary | ICD-10-CM

## 2012-03-01 NOTE — Assessment & Plan Note (Signed)
Epworth scale score 12.  Arrange home sleep study.

## 2012-03-01 NOTE — Patient Instructions (Addendum)
Our office will contact you re: home sleep study

## 2012-03-01 NOTE — Assessment & Plan Note (Signed)
Reviewed adult health maintenance protocols. Patient counseled on diet and exercise.  PAP and Pelvic completed by GYN.

## 2012-03-01 NOTE — Progress Notes (Signed)
Subjective:    Patient ID: Lindsay [name], female    DOB: January 19, 1971, 41 y.o.   MRN: [id number]  HPI  41 year old [place] American female with history of mild hypertension for routine physical. She denies any significant interval medical history. She was released by her nephrologist. Her blood pressure has been well-controlled on current dose of aldactone 50 mg once daily.  She was recently seen by her OB/GYN for routine Pap and pelvic- reported normal. She also complains of chronic fatigue that has been going on for over a year. She describes waking up tired and non-refreshing sleep. She has history of snoring.  Lab results reviewed.   Lab Results  Component Value Date   CHOL 140 02/23/2012   HDL 52.90 02/23/2012   LDLCALC 78 02/23/2012   TRIG 46.0 02/23/2012   CHOLHDL 3 02/23/2012   01/03/2012 TSH 1.06 hemoglobin 12.7 hematocrit 37.5, glucose 96, BUN 16, creatinine 0.85, sodium 144 potassium 4.5 LFTs-within normal limits Vitamin D 343, vitamin B12 744  She has 6 children at home.   She reports short term memory issues.  She is easily distracted.  Review of Systems   Constitutional: Negative for activity change, appetite change.  No able to sustain weight loss Eyes: Negative for visual disturbance.  Respiratory: Negative for cough, chest tightness and shortness of breath.   Cardiovascular: Negative for chest pain.  Genitourinary: Negative for difficulty urinating.  Neurological: Negative for headaches.  Gastrointestinal: Negative for abdominal pain, heartburn melena or hematochezia Psych: Negative for depression or anxiety  Past Medical History  Diagnosis Date  . Hypertension     History   Social History  . Marital Status: Married    Spouse Name: N/A    Number of Children: N/A  . Years of Education: N/A   Occupational History  . Not on file.   Social History Main Topics  . Smoking status: Never Smoker   . Smokeless tobacco: Not on file  . Alcohol Use: No  . Drug Use:  No  . Sexually Active: Yes    Birth Control/ Protection: None   Other Topics Concern  . Not on file   Social History Narrative  . No narrative on file    Past Surgical History  Procedure Date  . Tubal ligation     No family history on file.  No Known Allergies  Current Outpatient Prescriptions on File Prior to Visit  Medication Sig Dispense Refill  . spironolactone (ALDACTONE) 25 MG tablet Take 1 tablet (25 mg total) by mouth 2 (two) times daily.  60 tablet  0    BP 122/90  Pulse 76  Temp 98.7 F (37.1 C) (Oral)  Ht 5' 0.25" (1.53 m)  Wt 151 lb (68.493 kg)  BMI 29.25 kg/m2        Objective:   Physical Exam  Constitutional: She is oriented to person, place, and time. She appears well-developed and well-nourished.  HENT:  Head: Normocephalic and atraumatic.  Right Ear: External ear normal.  Left Ear: External ear normal.  Mouth/Throat: Oropharynx is clear and moist.       Crowded oropharynx  Eyes: EOM are normal. Pupils are equal, round, and reactive to light.  Neck: Neck supple.       No carotid bruit  Cardiovascular: Normal rate, regular rhythm and normal heart sounds.   Pulmonary/Chest: Effort normal and breath sounds normal. She has no wheezes.  Abdominal: Soft. Bowel sounds are normal. She exhibits no mass. There is no tenderness.  Musculoskeletal: She  exhibits no edema.  Lymphadenopathy:    She has no cervical adenopathy.  Neurological: She is alert and oriented to person, place, and time. No cranial nerve deficit.  Skin: Skin is warm and dry.  Psychiatric: She has a normal mood and affect. Her behavior is normal.      Assessment & Plan:

## 2012-03-22 ENCOUNTER — Telehealth: Payer: Self-pay

## 2012-03-22 NOTE — Telephone Encounter (Signed)
Returned patient's call at the home number listed. Phone rang, no answering machine ever picked up. Will attempt to contact patient again. Rhonda J Cobb

## 2012-03-22 NOTE — Telephone Encounter (Signed)
LMOAM at pt's home number for patient to return my call to my direct phone # [phone]. Rhonda J Cobb

## 2012-03-23 NOTE — Telephone Encounter (Signed)
Spoke with patient late yesterday evening and scheduled for her to pick up home sleep study device Friday 03/23/12. Pt was given address and instructed to come up to second floor and ask for me. Rhonda J Cobb

## 2012-03-26 NOTE — Telephone Encounter (Signed)
Patient p/u device on Friday 03/23/12 and completed study. Pt returned the device on Monday 03/26/12. Report download and placed in Dr. Teddy Spike folder. Rhonda J Cobb

## 2012-03-28 ENCOUNTER — Ambulatory Visit (INDEPENDENT_AMBULATORY_CARE_PROVIDER_SITE_OTHER): Payer: 59 | Admitting: Pulmonary Disease

## 2012-03-28 DIAGNOSIS — G4733 Obstructive sleep apnea (adult) (pediatric): Secondary | ICD-10-CM

## 2012-03-28 DIAGNOSIS — R0683 Snoring: Secondary | ICD-10-CM

## 2012-03-28 DIAGNOSIS — R5382 Chronic fatigue, unspecified: Secondary | ICD-10-CM

## 2012-04-02 ENCOUNTER — Telehealth: Payer: Self-pay | Admitting: Internal Medicine

## 2012-04-02 MED ORDER — SPIRONOLACTONE 25 MG PO TABS: 25.0000 mg | ORAL_TABLET | Freq: Two times a day (BID) | ORAL | Status: DC

## 2012-04-02 NOTE — Telephone Encounter (Signed)
Refill- aldactone 25mg  tablet. Take one tablet by mouth twice daily. Qty 60 last fill 8.7.13

## 2012-04-02 NOTE — Telephone Encounter (Signed)
rx sent in electronically 

## 2012-04-06 ENCOUNTER — Telehealth: Payer: Self-pay | Admitting: *Deleted

## 2012-04-06 NOTE — Telephone Encounter (Signed)
Message copied by Jacqualyn Posey on Fri Apr 06, 2012  4:13 PM ------      Message from: Ethelene Hal R      Created: Wed Mar 28, 2012  4:50 PM       Call pt - home sleep study did not show OSA but showed oxygen desaturations.  It may be due to oxygen probe falling off.  Dr. Shelle Iron suggest scheduling overnight pulse ox.              Cindy, please schedule overnight pulse ox.  Re: somnolence, nocturnal desaturations.            RY      ----- Message -----         From: Barbaraann Share, MD         Sent: 03/27/2012   6:53 PM           To: Meda Coffee, DO            Robert, just wanted to let you know this pt's home sleep test did not show clinically significant sleep apnea.  It did show oxygen desaturation that was unrelated to sleep apnea.  It appears there may have been probe movement that contributed to this.  Would do ONO on room air to verify if her desaturations are an issue.  Please see formal report that should be scanned into computer next 48 hrs.  Please let me know if any questions.

## 2012-04-06 NOTE — Telephone Encounter (Signed)
Pt aware, referral placed to [facility]

## 2012-04-06 NOTE — Telephone Encounter (Signed)
Left message for pt to call back  °

## 2012-05-02 ENCOUNTER — Encounter: Payer: Self-pay | Admitting: Internal Medicine

## 2013-04-01 ENCOUNTER — Encounter: Payer: Self-pay | Admitting: Internal Medicine

## 2013-05-24 ENCOUNTER — Other Ambulatory Visit: Payer: 59

## 2013-05-30 ENCOUNTER — Encounter: Payer: 59 | Admitting: Internal Medicine

## 2013-06-05 ENCOUNTER — Other Ambulatory Visit (INDEPENDENT_AMBULATORY_CARE_PROVIDER_SITE_OTHER): Payer: 59

## 2013-06-05 DIAGNOSIS — Z0000 Encounter for general adult medical examination without abnormal findings: Secondary | ICD-10-CM

## 2013-06-05 LAB — TSH: TSH: 1.16 u[IU]/mL (ref 0.35–5.50)

## 2013-06-05 LAB — POCT URINALYSIS DIPSTICK
Bilirubin, UA: NEGATIVE
Leukocytes, UA: NEGATIVE
Nitrite, UA: NEGATIVE
Urobilinogen, UA: 0.2
pH, UA: 6

## 2013-06-05 LAB — HEPATIC FUNCTION PANEL
ALT: 14 U/L (ref 0–35)
Albumin: 3.7 g/dL (ref 3.5–5.2)
Total Bilirubin: 0.5 mg/dL (ref 0.3–1.2)

## 2013-06-05 LAB — CBC WITH DIFFERENTIAL/PLATELET
Basophils Absolute: 0 10*3/uL (ref 0.0–0.1)
HCT: 34.9 % — ABNORMAL LOW (ref 36.0–46.0)
Lymphs Abs: 1.6 10*3/uL (ref 0.7–4.0)
MCV: 82.4 fl (ref 78.0–100.0)
Monocytes Absolute: 0.4 10*3/uL (ref 0.1–1.0)
Neutro Abs: 2.3 10*3/uL (ref 1.4–7.7)
Platelets: 256 10*3/uL (ref 150.0–400.0)
RDW: 16 % — ABNORMAL HIGH (ref 11.5–14.6)

## 2013-06-05 LAB — BASIC METABOLIC PANEL
BUN: 13 mg/dL (ref 6–23)
CO2: 23 mEq/L (ref 19–32)
Chloride: 107 mEq/L (ref 96–112)
Glucose, Bld: 88 mg/dL (ref 70–99)
Potassium: 4.1 mEq/L (ref 3.5–5.1)

## 2013-06-05 LAB — LIPID PANEL
HDL: 54.8 mg/dL (ref >39.00)
Total CHOL/HDL Ratio: 3

## 2013-06-21 ENCOUNTER — Encounter: Payer: 59 | Admitting: Internal Medicine

## 2013-08-05 ENCOUNTER — Ambulatory Visit (INDEPENDENT_AMBULATORY_CARE_PROVIDER_SITE_OTHER): Payer: 59 | Admitting: Internal Medicine

## 2013-08-05 ENCOUNTER — Encounter: Payer: Self-pay | Admitting: Internal Medicine

## 2013-08-05 VITALS — BP 132/94 | HR 76 | Temp 97.5°F | Ht 62.0 in | Wt 158.0 lb

## 2013-08-05 DIAGNOSIS — I10 Essential (primary) hypertension: Secondary | ICD-10-CM

## 2013-08-05 DIAGNOSIS — Z0000 Encounter for general adult medical examination without abnormal findings: Secondary | ICD-10-CM

## 2013-08-05 NOTE — Patient Instructions (Signed)
Please monitor your blood pressure at home as directed. Contact our office if our systolic blood pressure in consistently greater than 140. Follow low carb diet.  Increase your intake of lean meats and vegetables.  Avoid concentrated sweets Limit your calories to 1300 cal per day. You can follow up in 6 months if you are unable to achieve weight loss. Please complete the following lab tests before your next follow up appointment: CPX labs

## 2013-08-05 NOTE — Assessment & Plan Note (Signed)
Reviewed adult health maintenance protocols.  Patient counseled on diet exercise. Goal weight approximately 140 pounds. Routine Pap and pelvic completed by her oncologist. Patient updated with Tdap.  Screening labs within normal limits.

## 2013-08-05 NOTE — Assessment & Plan Note (Signed)
Patient has mild borderline hypertension. She declines antihypertensives for now. Continue lifestyle/dietary changes.

## 2013-08-05 NOTE — Progress Notes (Signed)
   Subjective:    Patient ID: Lindsay [name], female    DOB: 05/12/1971, 43 y.o.   MRN: [id number]  HPI  43 year old [place] with history of hypertension for routine physical. She denies any significant interval medical history. There has been mild weight gain since previous visit. She is a mother of 6 children.  No change in social history. She does not smoke or drink or use any recreational drugs.  Patient followed by her Gynecologist. She reports normal Pap smear in October of 2014.  Patient occasionally monitors her blood pressure at home. Her readings have been borderline.  We reviewed her blood work from 06/05/2014 in detail.  Review of Systems  Constitutional: Negative for activity change, appetite change and unexpected weight change.  Eyes: Negative for visual disturbance.  Respiratory: Negative for cough, chest tightness and shortness of breath.   Cardiovascular: Negative for chest pain.  Genitourinary: Negative for difficulty urinating.  Neurological: Negative for headaches.  Gastrointestinal: Negative for abdominal pain, heartburn melena or hematochezia Psych: Negative for depression or anxiety Endo:  No polyuria or polydypsia    Past Medical History  Diagnosis Date  . Hypertension     History   Social History  . Marital Status: Married    Spouse Name: N/A    Number of Children: N/A  . Years of Education: N/A   Occupational History  . Not on file.   Social History Main Topics  . Smoking status: Never Smoker   . Smokeless tobacco: Not on file  . Alcohol Use: No  . Drug Use: No  . Sexual Activity: Yes    Birth Control/ Protection: None   Other Topics Concern  . Not on file   Social History Narrative  . No narrative on file    Past Surgical History  Procedure Laterality Date  . Tubal ligation      No family history on file.  No Known Allergies  No current outpatient prescriptions on file prior to visit.   No current  facility-administered medications on file prior to visit.    BP 132/94  Pulse 76  Temp(Src) 97.5 F (36.4 C) (Oral)  Ht 5\' 2"  (1.575 m)  Wt 158 lb (71.668 kg)  BMI 28.89 kg/m2    Objective:   Physical Exam  Constitutional: She is oriented to person, place, and time. She appears well-developed and well-nourished. No distress.  HENT:  Head: Normocephalic and atraumatic.  Right Ear: External ear normal.  Left Ear: External ear normal.  Mouth/Throat: Oropharynx is clear and moist.  Hearing is grossly normal  Eyes: Conjunctivae and EOM are normal. Pupils are equal, round, and reactive to light. No scleral icterus.  Neck: Neck supple.  No carotid bruit  Cardiovascular: Normal rate and normal heart sounds.   No murmur heard. Pulmonary/Chest: Effort normal and breath sounds normal. She has no wheezes.  Abdominal: Soft. Bowel sounds are normal. She exhibits no mass. There is no tenderness.  Abdominal obesity  Musculoskeletal: Normal range of motion. She exhibits no edema.  Lymphadenopathy:    She has no cervical adenopathy.  Neurological: She is alert and oriented to person, place, and time. She has normal reflexes. No cranial nerve deficit.  Skin: Skin is warm and dry.  Psychiatric: She has a normal mood and affect. Her behavior is normal.          Assessment & Plan:

## 2013-08-05 NOTE — Progress Notes (Signed)
Pre visit review using our clinic review tool, if applicable. No additional management support is needed unless otherwise documented below in the visit note. 

## 2013-08-21 ENCOUNTER — Telehealth: Payer: Self-pay | Admitting: Internal Medicine

## 2013-08-21 NOTE — Telephone Encounter (Signed)
Relevant patient education mailed to patient.  

## 2014-10-24 ENCOUNTER — Other Ambulatory Visit: Payer: Self-pay

## 2014-10-28 ENCOUNTER — Other Ambulatory Visit (INDEPENDENT_AMBULATORY_CARE_PROVIDER_SITE_OTHER): Payer: 59

## 2014-10-28 DIAGNOSIS — Z0000 Encounter for general adult medical examination without abnormal findings: Secondary | ICD-10-CM

## 2014-10-28 LAB — POCT URINALYSIS DIPSTICK
Bilirubin, UA: NEGATIVE
GLUCOSE UA: NEGATIVE
Ketones, UA: NEGATIVE
Leukocytes, UA: NEGATIVE
NITRITE UA: NEGATIVE
PH UA: 6
Protein, UA: NEGATIVE
RBC UA: NEGATIVE
SPEC GRAV UA: 1.02
UROBILINOGEN UA: 0.2

## 2014-10-28 LAB — HEPATIC FUNCTION PANEL
ALT: 14 U/L (ref 0–35)
AST: 15 U/L (ref 0–37)
Albumin: 3.7 g/dL (ref 3.5–5.2)
Alkaline Phosphatase: 62 U/L (ref 39–117)
BILIRUBIN DIRECT: 0 mg/dL (ref 0.0–0.3)
BILIRUBIN TOTAL: 0.3 mg/dL (ref 0.2–1.2)
Total Protein: 7.2 g/dL (ref 6.0–8.3)

## 2014-10-28 LAB — CBC WITH DIFFERENTIAL/PLATELET
BASOS ABS: 0 10*3/uL (ref 0.0–0.1)
Basophils Relative: 0.4 % (ref 0.0–3.0)
Eosinophils Absolute: 0.1 10*3/uL (ref 0.0–0.7)
Eosinophils Relative: 2.3 % (ref 0.0–5.0)
HEMATOCRIT: 36.1 % (ref 36.0–46.0)
HEMOGLOBIN: 11.8 g/dL — AB (ref 12.0–15.0)
LYMPHS ABS: 1.6 10*3/uL (ref 0.7–4.0)
Lymphocytes Relative: 31.7 % (ref 12.0–46.0)
MCHC: 32.8 g/dL (ref 30.0–36.0)
MCV: 84.1 fl (ref 78.0–100.0)
MONO ABS: 0.5 10*3/uL (ref 0.1–1.0)
MONOS PCT: 9.4 % (ref 3.0–12.0)
NEUTROS ABS: 2.8 10*3/uL (ref 1.4–7.7)
Neutrophils Relative %: 56.2 % (ref 43.0–77.0)
PLATELETS: 272 10*3/uL (ref 150.0–400.0)
RBC: 4.29 Mil/uL (ref 3.87–5.11)
RDW: 14.4 % (ref 11.5–15.5)
WBC: 5.1 10*3/uL (ref 4.0–10.5)

## 2014-10-28 LAB — BASIC METABOLIC PANEL
BUN: 12 mg/dL (ref 6–23)
CO2: 26 mEq/L (ref 19–32)
Calcium: 8.9 mg/dL (ref 8.4–10.5)
Chloride: 107 mEq/L (ref 96–112)
Creatinine, Ser: 0.84 mg/dL (ref 0.40–1.20)
GFR: 95.21 mL/min (ref >60.00)
Glucose, Bld: 85 mg/dL (ref 70–99)
POTASSIUM: 4.1 meq/L (ref 3.5–5.1)
SODIUM: 137 meq/L (ref 135–145)

## 2014-10-28 LAB — TSH: TSH: 1.88 u[IU]/mL (ref 0.35–4.50)

## 2014-10-28 LAB — LIPID PANEL
Cholesterol: 141 mg/dL (ref 0–200)
HDL: 49.7 mg/dL (ref >39.00)
LDL Cholesterol: 83 mg/dL (ref 0–99)
NONHDL: 91.3
Total CHOL/HDL Ratio: 3
Triglycerides: 41 mg/dL (ref 0.0–149.0)
VLDL: 8.2 mg/dL (ref 0.0–40.0)

## 2014-10-31 ENCOUNTER — Encounter: Payer: Self-pay | Admitting: Internal Medicine

## 2014-11-17 ENCOUNTER — Ambulatory Visit (INDEPENDENT_AMBULATORY_CARE_PROVIDER_SITE_OTHER): Payer: 59 | Admitting: Adult Health

## 2014-11-17 ENCOUNTER — Encounter: Payer: Self-pay | Admitting: Internal Medicine

## 2014-11-17 ENCOUNTER — Encounter: Payer: Self-pay | Admitting: Adult Health

## 2014-11-17 VITALS — BP 120/80 | Temp 98.6°F | Ht 61.0 in | Wt 163.0 lb

## 2014-11-17 DIAGNOSIS — I10 Essential (primary) hypertension: Secondary | ICD-10-CM | POA: Diagnosis not present

## 2014-11-17 DIAGNOSIS — Z0000 Encounter for general adult medical examination without abnormal findings: Secondary | ICD-10-CM

## 2014-11-17 NOTE — Assessment & Plan Note (Signed)
We reviewed adult healthy maintenance protocols. Patient was educated on diet and exercise. She has gained 5 pounds since last visit. Her pap is completed by her oncologist. Screening labs within normal limits

## 2014-11-17 NOTE — Patient Instructions (Addendum)
Continue to eat a healthy diet and exercise. Lets plan on five pound weight reduction in the next six months.It was great meeting you!   Health Maintenance Adopting a healthy lifestyle and getting preventive care can go a long way to promote health and wellness. Talk with your health care provider about what schedule of regular examinations is right for you. This is a good chance for you to check in with your provider about disease prevention and staying healthy. In between checkups, there are plenty of things you can do on your own. Experts have done a lot of research about which lifestyle changes and preventive measures are most likely to keep you healthy. Ask your health care provider for more information. WEIGHT AND DIET  Eat a healthy diet  Be sure to include plenty of vegetables, fruits, low-fat dairy products, and lean protein.  Do not eat a lot of foods high in solid fats, added sugars, or salt.  Get regular exercise. This is one of the most important things you can do for your health.  Most adults should exercise for at least 150 minutes each week. The exercise should increase your heart rate and make you sweat (moderate-intensity exercise).  Most adults should also do strengthening exercises at least twice a week. This is in addition to the moderate-intensity exercise.  Maintain a healthy weight  Body mass index (BMI) is a measurement that can be used to identify possible weight problems. It estimates body fat based on height and weight. Your health care provider can help determine your BMI and help you achieve or maintain a healthy weight.  For females 43 years of age and older:   A BMI below 18.5 is considered underweight.  A BMI of 18.5 to 24.9 is normal.  A BMI of 25 to 29.9 is considered overweight.  A BMI of 30 and above is considered obese.  Watch levels of cholesterol and blood lipids  You should start having your blood tested for lipids and cholesterol at 43  years of age, then have this test every 5 years.  You may need to have your cholesterol levels checked more often if:  Your lipid or cholesterol levels are high.  You are older than 43 years of age.  You are at high risk for heart disease.  CANCER SCREENING   Lung Cancer  Lung cancer screening is recommended for adults 43-51 years old who are at high risk for lung cancer because of a history of smoking.  A yearly low-dose CT scan of the lungs is recommended for people who:  Currently smoke.  Have quit within the past 15 years.  Have at least a 43-pack-year history of smoking. A pack year is smoking an average of one pack of cigarettes a day for 1 year.  Yearly screening should continue until it has been 15 years since you quit.  Yearly screening should stop if you develop a health problem that would prevent you from having lung cancer treatment.  Breast Cancer  Practice breast self-awareness. This means understanding how your breasts normally appear and feel.  It also means doing regular breast self-exams. Let your health care provider know about any changes, no matter how small.  If you are in your 20s or 30s, you should have a clinical breast exam (CBE) by a health care provider every 1-3 years as part of a regular health exam.  If you are 1 or older, have a CBE every year. Also consider having a breast X-ray (  mammogram) every year.  If you have a family history of breast cancer, talk to your health care provider about genetic screening.  If you are at high risk for breast cancer, talk to your health care provider about having an MRI and a mammogram every year.  Breast cancer gene (BRCA) assessment is recommended for women who have family members with BRCA-related cancers. BRCA-related cancers include:  Breast.  Ovarian.  Tubal.  Peritoneal cancers.  Results of the assessment will determine the need for genetic counseling and BRCA1 and BRCA2 testing. Cervical  Cancer Routine pelvic examinations to screen for cervical cancer are no longer recommended for nonpregnant women who are considered low risk for cancer of the pelvic organs (ovaries, uterus, and vagina) and who do not have symptoms. A pelvic examination may be necessary if you have symptoms including those associated with pelvic infections. Ask your health care provider if a screening pelvic exam is right for you.   The Pap test is the screening test for cervical cancer for women who are considered at risk.  If you had a hysterectomy for a problem that was not cancer or a condition that could lead to cancer, then you no longer need Pap tests.  If you are older than 65 years, and you have had normal Pap tests for the past 10 years, you no longer need to have Pap tests.  If you have had past treatment for cervical cancer or a condition that could lead to cancer, you need Pap tests and screening for cancer for at least 20 years after your treatment.  If you no longer get a Pap test, assess your risk factors if they change (such as having a new sexual partner). This can affect whether you should start being screened again.  Some women have medical problems that increase their chance of getting cervical cancer. If this is the case for you, your health care provider may recommend more frequent screening and Pap tests.  The human papillomavirus (HPV) test is another test that may be used for cervical cancer screening. The HPV test looks for the virus that can cause cell changes in the cervix. The cells collected during the Pap test can be tested for HPV.  The HPV test can be used to screen women 30 years of age and older. Getting tested for HPV can extend the interval between normal Pap tests from three to five years.  An HPV test also should be used to screen women of any age who have unclear Pap test results.  After 43 years of age, women should have HPV testing as often as Pap tests.  Colorectal  Cancer  This type of cancer can be detected and often prevented.  Routine colorectal cancer screening usually begins at 43 years of age and continues through 43 years of age.  Your health care provider may recommend screening at an earlier age if you have risk factors for colon cancer.  Your health care provider may also recommend using home test kits to check for hidden blood in the stool.  A small camera at the end of a tube can be used to examine your colon directly (sigmoidoscopy or colonoscopy). This is done to check for the earliest forms of colorectal cancer.  Routine screening usually begins at age 50.  Direct examination of the colon should be repeated every 5-10 years through 43 years of age. However, you may need to be screened more often if early forms of precancerous polyps or small growths   are found. Skin Cancer  Check your skin from head to toe regularly.  Tell your health care provider about any new moles or changes in moles, especially if there is a change in a mole's shape or color.  Also tell your health care provider if you have a mole that is larger than the size of a pencil eraser.  Always use sunscreen. Apply sunscreen liberally and repeatedly throughout the day.  Protect yourself by wearing long sleeves, pants, a wide-brimmed hat, and sunglasses whenever you are outside. HEART DISEASE, DIABETES, AND HIGH BLOOD PRESSURE   Have your blood pressure checked at least every 1-2 years. High blood pressure causes heart disease and increases the risk of stroke.  If you are between 55 years and 79 years old, ask your health care provider if you should take aspirin to prevent strokes.  Have regular diabetes screenings. This involves taking a blood sample to check your fasting blood sugar level.  If you are at a normal weight and have a low risk for diabetes, have this test once every three years after 43 years of age.  If you are overweight and have a high risk for  diabetes, consider being tested at a younger age or more often. PREVENTING INFECTION  Hepatitis B  If you have a higher risk for hepatitis B, you should be screened for this virus. You are considered at high risk for hepatitis B if:  You were born in a country where hepatitis B is common. Ask your health care provider which countries are considered high risk.  Your parents were born in a high-risk country, and you have not been immunized against hepatitis B (hepatitis B vaccine).  You have HIV or AIDS.  You use needles to inject street drugs.  You live with someone who has hepatitis B.  You have had sex with someone who has hepatitis B.  You get hemodialysis treatment.  You take certain medicines for conditions, including cancer, organ transplantation, and autoimmune conditions. Hepatitis C  Blood testing is recommended for:  Everyone born from 1945 through 1965.  Anyone with known risk factors for hepatitis C. Sexually transmitted infections (STIs)  You should be screened for sexually transmitted infections (STIs) including gonorrhea and chlamydia if:  You are sexually active and are younger than 43 years of age.  You are older than 43 years of age and your health care provider tells you that you are at risk for this type of infection.  Your sexual activity has changed since you were last screened and you are at an increased risk for chlamydia or gonorrhea. Ask your health care provider if you are at risk.  If you do not have HIV, but are at risk, it may be recommended that you take a prescription medicine daily to prevent HIV infection. This is called pre-exposure prophylaxis (PrEP). You are considered at risk if:  You are sexually active and do not regularly use condoms or know the HIV status of your partner(s).  You take drugs by injection.  You are sexually active with a partner who has HIV. Talk with your health care provider about whether you are at high risk of  being infected with HIV. If you choose to begin PrEP, you should first be tested for HIV. You should then be tested every 3 months for as long as you are taking PrEP.  PREGNANCY   If you are premenopausal and you may become pregnant, ask your health care provider about preconception counseling.    If you may become pregnant, take 400 to 800 micrograms (mcg) of folic acid every day.  If you want to prevent pregnancy, talk to your health care provider about birth control (contraception). OSTEOPOROSIS AND MENOPAUSE   Osteoporosis is a disease in which the bones lose minerals and strength with aging. This can result in serious bone fractures. Your risk for osteoporosis can be identified using a bone density scan.  If you are 65 years of age or older, or if you are at risk for osteoporosis and fractures, ask your health care provider if you should be screened.  Ask your health care provider whether you should take a calcium or vitamin D supplement to lower your risk for osteoporosis.  Menopause may have certain physical symptoms and risks.  Hormone replacement therapy may reduce some of these symptoms and risks. Talk to your health care provider about whether hormone replacement therapy is right for you.  HOME CARE INSTRUCTIONS   Schedule regular health, dental, and eye exams.  Stay current with your immunizations.   Do not use any tobacco products including cigarettes, chewing tobacco, or electronic cigarettes.  If you are pregnant, do not drink alcohol.  If you are breastfeeding, limit how much and how often you drink alcohol.  Limit alcohol intake to no more than 1 drink per day for nonpregnant women. One drink equals 12 ounces of beer, 5 ounces of wine, or 1 ounces of hard liquor.  Do not use street drugs.  Do not share needles.  Ask your health care provider for help if you need support or information about quitting drugs.  Tell your health care provider if you often feel  depressed.  Tell your health care provider if you have ever been abused or do not feel safe at home. Document Released: 01/17/2011 Document Revised: 11/18/2013 Document Reviewed: 06/05/2013 [employer] Patient Information 2015 [employer]. This information is not intended to replace advice given to you by your health care provider. Make sure you discuss any questions you have with your health care provider.  

## 2014-11-17 NOTE — Progress Notes (Signed)
   HPI:  44 year old African American female with history of hypertension for routine physical. She denies any significant interval medical history. There has been mild weight gain since previous visit. She is a mother of 6 children.  No change in social history. She does not smoke or drink or use any recreational drugs.   Patient occasionally monitors her blood pressure at home. Her readings have been borderline.  We reviewed her blood work in detail   ROS negative for unless reported above: fevers, unintentional weight loss, hearing or vision loss, chest pain, palpitations, struggling to breath, hemoptysis, melena, hematochezia, hematuria, falls, loc, si, thoughts of self harm  Past Medical History  Diagnosis Date  . Hypertension     Past Surgical History  Procedure Laterality Date  . Tubal ligation      Family History  Problem Relation Age of Onset  . Hypertension Father   . Lung cancer Mother     Mother was a smoker  . Diabetes Mellitus II Maternal Uncle     History   Social History  . Marital Status: Married    Spouse Name: N/A  . Number of Children: N/A  . Years of Education: N/A   Social History Main Topics  . Smoking status: Never Smoker   . Smokeless tobacco: Not on file  . Alcohol Use: No  . Drug Use: No  . Sexual Activity: Yes    Birth Control/ Protection: None   Other Topics Concern  . 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  EXAM:  Filed Vitals:   11/17/14 1306  BP: 120/80  Temp: 98.6 F (37 C)    Body mass index is 30.81 kg/(m^2).  GENERAL: vitals reviewed and listed above, alert, oriented, appears well hydrated and in no acute distress  HEENT: atraumatic, conjunttiva clear, no obvious abnormalities on inspection of external nose and ears. Hearing is grossly normal.   NECK: no obvious masses on inspection  LUNGS: clear to auscultation bilaterally, no wheezes,  rales or rhonchi, good air movement  CV: HRRR, no peripheral edema. No carotid bruit.  MS: moves all extremities without noticeable abnormality. No edema noted  Abd: soft/nontender/nondistended/normal bowel sounds. Obese around abdoman  Skin: warm and dry, no rash   Neuro: CN II-XII intact, sensation and reflexes normal throughout, 5/5 muscle strength in bilateral upper and lower extremities. Normal finger to nose. Normal rapid alternating movements. Normal romberg. No pronator drift.   PSYCH: pleasant and cooperative, no obvious depression or anxiety  ASSESSMENT AND PLAN:

## 2014-11-17 NOTE — Progress Notes (Signed)
Pre visit review using our clinic review tool, if applicable. No additional management support is needed unless otherwise documented below in the visit note. 

## 2014-11-17 NOTE — Assessment & Plan Note (Signed)
Her blood pressure has improved. Today is is 120/80. Continue with lifestyle and dietary changes.

## 2014-11-25 LAB — HM MAMMOGRAPHY: HM Mammogram: NORMAL

## 2014-11-27 ENCOUNTER — Other Ambulatory Visit: Payer: Self-pay | Admitting: Internal Medicine

## 2014-12-30 ENCOUNTER — Ambulatory Visit (INDEPENDENT_AMBULATORY_CARE_PROVIDER_SITE_OTHER): Payer: 59

## 2014-12-30 DIAGNOSIS — Z23 Encounter for immunization: Secondary | ICD-10-CM

## 2014-12-30 DIAGNOSIS — Z111 Encounter for screening for respiratory tuberculosis: Secondary | ICD-10-CM

## 2014-12-30 DIAGNOSIS — Z0184 Encounter for antibody response examination: Secondary | ICD-10-CM

## 2015-01-01 ENCOUNTER — Other Ambulatory Visit: Payer: 59

## 2015-01-01 ENCOUNTER — Other Ambulatory Visit: Payer: Self-pay | Admitting: Internal Medicine

## 2015-01-01 ENCOUNTER — Other Ambulatory Visit (INDEPENDENT_AMBULATORY_CARE_PROVIDER_SITE_OTHER): Payer: 59

## 2015-01-01 DIAGNOSIS — Z23 Encounter for immunization: Secondary | ICD-10-CM

## 2015-01-01 DIAGNOSIS — Z1159 Encounter for screening for other viral diseases: Secondary | ICD-10-CM

## 2015-01-01 NOTE — Addendum Note (Signed)
Addended by: Azucena Freed on: 01/01/2015 10:15 AM   Modules accepted: Orders

## 2015-01-02 LAB — MEASLES/MUMPS/RUBELLA IMMUNITY
MUMPS IGG: 241 [AU]/ml — AB (ref <9.00)
RUBELLA: 2.05 {index} — AB (ref <0.90)
Rubeola IgG: >300 AU/mL — ABNORMAL HIGH (ref <25.00)

## 2015-01-02 LAB — HEPATITIS B SURFACE ANTIBODY,QUALITATIVE: Hep B S Ab: NEGATIVE

## 2015-01-07 LAB — HEPATITIS B SURFACE ANTIBODY, QUANTITATIVE: HEPATITIS B-POST: 0 m[IU]/mL

## 2015-01-15 ENCOUNTER — Ambulatory Visit (INDEPENDENT_AMBULATORY_CARE_PROVIDER_SITE_OTHER): Payer: 59 | Admitting: *Deleted

## 2015-01-15 DIAGNOSIS — Z23 Encounter for immunization: Secondary | ICD-10-CM

## 2015-06-22 ENCOUNTER — Telehealth: Payer: Self-pay | Admitting: Internal Medicine

## 2015-06-22 NOTE — Telephone Encounter (Signed)
Appointment scheduled with Dr Clent RidgesFry 06/23/15 at 2 pm

## 2015-06-22 NOTE — Telephone Encounter (Signed)
Pt called saying her BP has been high the past few days. On Sat it was in the 150's...yesterday 170's..today it read as 179/117. She'd like to be worked in this afternoon if possible. Please give her a phone call because I told her the first available appt is tomorrow.   Pt's ph# [phone] Thank you.

## 2015-06-22 NOTE — Telephone Encounter (Signed)
Patient called back and said she is leaving for work, so please call [phone] instead.

## 2015-06-22 NOTE — Telephone Encounter (Signed)
Dr Artist PaisYoo is not available.  Left message on machine for patient to return our call.

## 2015-06-23 ENCOUNTER — Ambulatory Visit (INDEPENDENT_AMBULATORY_CARE_PROVIDER_SITE_OTHER): Payer: Commercial Managed Care - HMO | Admitting: Family Medicine

## 2015-06-23 ENCOUNTER — Encounter: Payer: Self-pay | Admitting: Family Medicine

## 2015-06-23 VITALS — BP 168/100 | HR 82 | Temp 98.9°F | Ht 61.0 in | Wt 157.0 lb

## 2015-06-23 DIAGNOSIS — I10 Essential (primary) hypertension: Secondary | ICD-10-CM | POA: Diagnosis not present

## 2015-06-23 DIAGNOSIS — Z23 Encounter for immunization: Secondary | ICD-10-CM | POA: Diagnosis not present

## 2015-06-23 MED ORDER — LISINOPRIL 10 MG PO TABS
10.0000 mg | ORAL_TABLET | Freq: Every day | ORAL | Status: DC
Start: 2015-06-23 — End: 2015-10-07

## 2015-06-23 NOTE — Progress Notes (Signed)
   Subjective:    Patient ID: Lindsay [name], female    DOB: May 03, 1971, 44 y.o.   MRN: [id number]  HPI Here for high BP readings. She was treated for HTN with medications up until 6 years ago. At that point she was able to stop and has been controlling this with diet and exercise. However she notes that last week she took a beach vacation and also ate a lot of holiday food that she normally avoids, and over the weekend she began to feel tired and she developed headaches. No chest pain or SOB. She took her BP and one day it was 168/110 and yesterday it was 178/119. Today she feels back to normal. She had a cpx here with normal labs in May.    Review of Systems  Constitutional: Negative.   Respiratory: Negative.   Cardiovascular: Negative.   Endocrine: Negative.   Neurological: Negative.        Objective:   Physical Exam  Constitutional: She is oriented to person, place, and time. She appears well-developed and well-nourished.  Neck: No thyromegaly present.  Cardiovascular: Normal rate, regular rhythm, normal heart sounds and intact distal pulses.   Pulmonary/Chest: Effort normal and breath sounds normal.  Musculoskeletal: She exhibits no edema.  Lymphadenopathy:    She has no cervical adenopathy.  Neurological: She is alert and oriented to person, place, and time.          Assessment & Plan:  HTN. I think she can probably get back on her low calorie , low sodium diet and get this under control, but for the near future we need to bring it down quickly. She will start on Lisinopril 10 mg daily. Recheck in one month

## 2015-06-23 NOTE — Progress Notes (Signed)
Pre visit review using our clinic review tool, if applicable. No additional management support is needed unless otherwise documented below in the visit note. 

## 2015-07-29 ENCOUNTER — Ambulatory Visit: Payer: Commercial Managed Care - HMO | Admitting: Adult Health

## 2015-08-04 ENCOUNTER — Ambulatory Visit (INDEPENDENT_AMBULATORY_CARE_PROVIDER_SITE_OTHER): Payer: Commercial Managed Care - HMO | Admitting: Adult Health

## 2015-08-04 ENCOUNTER — Encounter: Payer: Self-pay | Admitting: Adult Health

## 2015-08-04 VITALS — BP 140/90 | Temp 98.5°F | Ht 61.0 in | Wt 155.1 lb

## 2015-08-04 DIAGNOSIS — I10 Essential (primary) hypertension: Secondary | ICD-10-CM

## 2015-08-04 NOTE — Progress Notes (Signed)
Subjective:    Patient ID: Lindsay [name], female    DOB: October 19, 1970, 45 y.o.   MRN: [id number]  HPI  45 year old female, patient of Dr. Artist Pais. She returns today for follow up regarding her blood pressure. She was last seen by Dr. Clent Ridges on December 6th after she noticed her blood pressure was elevated. She has taken blood pressure medication in the past but over the past six years it was controlled with diet and exercise. She was started on Lisinopril 10 mg by MD Clent Ridges. Today she endorses that she feels as through her blood pressure readings have been lower, she is not been monitoring at home. She denies any headaches, blurred vision, or dizziness.   Today in the office her blood pressure is still slightly elevated at 140/90, she took her medication about 20 minutes ago.  She has lost weight and is currently at 155. She is not exercising but understands that she needs to exercise.     Wt Readings from Last 3 Encounters:  08/04/15 155 lb 1.6 oz (70.353 kg)  06/23/15 157 lb (71.215 kg)  11/17/14 163 lb (73.936 kg)    Review of Systems  Constitutional: Negative.   Respiratory: Negative.   Cardiovascular: Negative.   Neurological: Negative.   All other systems reviewed and are negative.  Past Medical History  Diagnosis Date  . Hypertension     Social History   Social History  . Marital Status: Married    Spouse Name: N/A  . Number of Children: N/A  . Years of Education: N/A   Occupational History  . Not on file.   Social History Main Topics  . Smoking status: Never Smoker   . Smokeless tobacco: Not on file  . Alcohol Use: No  . Drug Use: No  . Sexual Activity: Yes    Birth Control/ Protection: None   Other Topics Concern  . 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 Tubal ligation      Family History  Problem Relation Age of Onset  . Hypertension Father   . Lung cancer Mother    Mother was a smoker  . Diabetes Mellitus II Maternal Uncle     Allergies  Allergen Reactions  . Ibuprofen Hypertension    Current Outpatient Prescriptions on File Prior to Visit  Medication Sig Dispense Refill  . lisinopril (PRINIVIL,ZESTRIL) 10 MG tablet Take 1 tablet (10 mg total) by mouth daily. 30 tablet 3   No current facility-administered medications on file prior to visit.    Temp(Src) 98.5 F (36.9 C) (Oral)  Ht  (1.549 m)  Wt 155 lb 1.6 oz (70.353 kg)  BMI 29.32 kg/m2  LMP 07/31/2015        Objective:   Physical Exam  Constitutional: She is oriented to person, place, and time. She appears well-developed and well-nourished.  Neck: No thyromegaly present.  Cardiovascular: Normal rate, regular rhythm, normal heart sounds and intact distal pulses.  Pulmonary/Chest: Effort normal and breath sounds normal.  Musculoskeletal: She exhibits no edema.  Lymphadenopathy:   She has no cervical adenopathy.  Neurological: She is alert and oriented to person, place, and time.     Assessment & Plan:  1. Essential hypertension - Continue with lisinopril 10 mg.  - Follow up in three weeks for HTN  - Monitor BP at home and bring in journal to next office  visit.  - Follow up sooner if needed

## 2015-08-04 NOTE — Progress Notes (Signed)
Pre visit review using our clinic review tool, if applicable. No additional management support is needed unless otherwise documented below in the visit note. 

## 2015-08-04 NOTE — Patient Instructions (Signed)
It was great seeing you again!  1. Follow up with me in 3 weeks. Monitor your blood pressure at home and start and exercise regimen.   2. Follow up with me for an establish care visit.   I want to get you off this blood pressure medication and I know you want to be off it too!

## 2015-08-25 ENCOUNTER — Ambulatory Visit: Payer: Commercial Managed Care - HMO | Admitting: Adult Health

## 2015-08-25 ENCOUNTER — Telehealth: Payer: Self-pay

## 2015-08-25 NOTE — Telephone Encounter (Signed)
noted 

## 2015-08-25 NOTE — Telephone Encounter (Signed)
Spoke with pt and offer her an appt but she refused. i told her if she doesn't feel good by noon to give [place] a call    PLEASE NOTE: All timestamps contained within this report are represented as [place] Standard Time. CONFIDENTIALTY NOTICE: This fax transmission is intended only for the addressee. It contains information that is legally privileged, confidential or otherwise protected from use or disclosure. If you are not the intended recipient, you are strictly prohibited from reviewing, disclosing, copying using or disseminating any of this information or taking any action in reliance on or regarding this information. If you have received this fax in error, please notify us immediately by telephone so that we can arrange for its return to [place]. Phone: [phone], Toll-Free: [phone], Fax: [phone] Page: 1 of 2 Call Id: [id number] [facility] Night - Client TELEPHONE ADVICE RECORD [facility] Patient Name: LANECIA SLIVA Gender: Female DOB: [birth year] Age: 58 Y 8 M 23 D Return Phone Number: [phone] (Primary), [phone] (Secondary) Address: City/State/Zip: [place] Client [facility] Night - Client Client Site [facility] - Night Contact Type Call Call Type Triage / Clinical Relationship To Patient Self Return Phone Number [phone] (Secondary) Chief Complaint Urination Frequency Initial Comment Caller states she has been going to the bathroom all night PreDisposition Call Doctor Nurse Assessment Nurse: Williams Che, RN, April Date/Time [name] Time): 08/25/2015 6:30:14 AM Confirm and document reason for call. If symptomatic, describe symptoms. You must click the next button to save text entered. ---Caller states she has been having diarrhea all night, approximately 5 or so times. She states her stomach feels weak. She feels nauseous but has not vomited. Has the patient traveled out of the  country within the last 30 days? ---No Does the patient have any new or worsening symptoms? ---Yes Will a triage be completed? ---Yes Related visit to physician within the last 2 weeks? ---No Does the PT have any chronic conditions? (i.e. diabetes, asthma, etc.) ---Yes List chronic conditions. ---high blood pressure Did the patient indicate they were pregnant? ---No Is this a behavioral health or substance abuse call? ---No Guidelines Guideline Title Affirmed Question Affirmed Notes Nurse Date/Time (Eastern Time) Diarrhea MILD-MODERATE diarrhea (e.g., 1-6 times / day more than normal) (all triage questions negative) Hubbard, RN, April 08/25/2015 6:33:23 AM Disp. Time [name] Time) Disposition Final User 08/25/2015 6:40:00 AM Home Care Yes Williams Che, RN, April Caller Understands: Yes PLEASE NOTE: All timestamps contained within this report are represented as [place] Standard Time. CONFIDENTIALTY NOTICE: This fax transmission is intended only for the addressee. It contains information that is legally privileged, confidential or otherwise protected from use or disclosure. If you are not the intended recipient, you are strictly prohibited from reviewing, disclosing, copying using or disseminating any of this information or taking any action in reliance on or regarding this information. If you have received this fax in error, please notify us immediately by telephone so that we can arrange for its return to [place]. Phone: [phone], Toll-Free: [phone], Fax: [phone] Page: 2 of 2 Call Id: [id number] Disagree/Comply: Comply Care Advice Given Per Guideline REASSURANCE: * Sometimes the cause is an infection caused by a virus ('stomach flu) or a bacteria. Diarrhea is one of the body's way of getting rid of germs. FLUID THERAPY DURING MILD-MODERATE DIARRHEA: * Drink more fluids, at least 8-10 cups daily. One cup equals 8 oz (240 ml). * WATER: For mild to moderate diarrhea, water is often the  best liquid  to drink. You should also eat some salty foods (e.g., potato chips, pretzels, saltine crackers). This is important to make sure you are getting enough salt, sugars, and fluids to meet your body's needs. * Avoid caffeinated beverages (Reason: caffeine is mildly dehydrating). * Avoid alcohol beverages (beer, wine, hard liquor). * Begin with boiled starches / cereals (e.g., potatoes, rice, noodles, wheat, oats) with a small amount of salt to taste. * Other foods that are OK include: bananas, yogurt, crackers, soup. * As the diarrhea starts to get better, you can slowly return to a normal diet. CONTAGIOUSNESS: * If your work is cooking, handling, serving or preparing food, then you should not work until the diarrhea has completely stopped. CALL BACK IF: * Signs of dehydration occur (e.g., no urine over 12 hours, very dry mouth, lightheaded, etc.) * You become worse. After Care Instructions Given Call Event Type User Date / Time Description

## 2015-10-07 ENCOUNTER — Encounter: Payer: Self-pay | Admitting: Adult Health

## 2015-10-07 ENCOUNTER — Ambulatory Visit (INDEPENDENT_AMBULATORY_CARE_PROVIDER_SITE_OTHER): Payer: Commercial Managed Care - HMO | Admitting: Adult Health

## 2015-10-07 VITALS — BP 146/96 | Temp 99.0°F | Ht 61.0 in | Wt 154.0 lb

## 2015-10-07 DIAGNOSIS — I10 Essential (primary) hypertension: Secondary | ICD-10-CM

## 2015-10-07 DIAGNOSIS — Z7189 Other specified counseling: Secondary | ICD-10-CM | POA: Diagnosis not present

## 2015-10-07 DIAGNOSIS — R059 Cough, unspecified: Secondary | ICD-10-CM

## 2015-10-07 DIAGNOSIS — Z7689 Persons encountering health services in other specified circumstances: Secondary | ICD-10-CM

## 2015-10-07 DIAGNOSIS — R05 Cough: Secondary | ICD-10-CM | POA: Diagnosis not present

## 2015-10-07 MED ORDER — LISINOPRIL 20 MG PO TABS
20.0000 mg | ORAL_TABLET | Freq: Every day | ORAL | Status: DC
Start: 2015-10-07 — End: 2015-12-01

## 2015-10-07 NOTE — Patient Instructions (Signed)
It was great seeing you again!  I have increased your lisinopril to 20mg .  Use Claritin, Allegra, or Zyrtec along with Flonase.   Follow up with me in 2 weeks - bring your journal   Follow up as well for physical.

## 2015-10-07 NOTE — Progress Notes (Signed)
Patient presents to clinic today to establish care. She is a very pleasant 45 year old African-American female  has a past medical history of Hypertension.  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   Acute Concerns: Establish Care  Cough  - She has had a dry cough since January. She was started on Lisinopril in December. He does have associated postnasal drip and occasional rhinorrhea since January as well. She denies any fevers, sinus pain and pressure, or feeling acutely ill  Chronic Issues:  Hypertension  - Once was diet controlled unfortunately she has quit exercising and in December was placed  on lisinopril 10 mg. When I saw her for her follow-up appointment after starting lisinopril her blood pressure she reported at home was much better. Currently she reports that her blood pressures have been in the 150s over 80s, unfortunately she forgot her journal at home  Health Maintenance: Dental -- Three times a day  Vision -- Yearly  Mammogram -- 11/2014  PAP -- 2015 - no abnormal's in the past Exercise: She is not exercising  Diet: Tries to eat healthy.   Past Medical History  Diagnosis Date  . Hypertension     Past Surgical History  Procedure Laterality Date  . Tubal ligation      Current Outpatient Prescriptions on File Prior to Visit  Medication Sig Dispense Refill  . lisinopril (PRINIVIL,ZESTRIL) 10 MG tablet Take 1 tablet (10 mg total) by mouth daily. 30 tablet 3   No current facility-administered medications on file prior to visit.    Allergies  Allergen Reactions  . Ibuprofen Hypertension    Family History  Problem Relation Age of Onset  . Hypertension Father   . Lung cancer Mother     Mother was a smoker  . Diabetes Mellitus II Maternal Uncle     Social History   Social History  . Marital Status: Married    Spouse Name: N/A  . Number of Children: N/A  . Years of Education: N/A   Occupational History  . Not on file.    Social History Main Topics  . Smoking status: Never Smoker   . Smokeless tobacco: Not on file  . Alcohol Use: No  . Drug Use: No  . Sexual Activity: Yes    Birth Control/ Protection: None   Other Topics Concern  . 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   HENT: Negative.   Eyes: Negative.   Respiratory: Negative.   Cardiovascular: Negative.   Gastrointestinal: Negative.   Genitourinary: Negative.   Musculoskeletal: Negative.   Skin: Negative.   Neurological: Negative.   Endo/Heme/Allergies: Negative.   Psychiatric/Behavioral: Negative.     BP 146/96 mmHg  Temp(Src) 99 F (37.2 C) (Oral)  Ht 5\' 1"  (1.549 m)  Wt 154 lb (69.854 kg)  BMI 29.11 kg/m2  Physical Exam  Constitutional: She is oriented to person, place, and time and well-developed, well-nourished, and in no distress. No distress.  Slightly obese  HENT:  Head: Normocephalic and atraumatic.  Right Ear: External ear normal.  Left Ear: External ear normal.  Nose: Nose normal.  Mouth/Throat: Oropharynx is clear and moist.  Eyes: Conjunctivae and EOM are normal. Pupils are equal, round, and reactive to light. Right eye exhibits no discharge. Left eye exhibits no discharge. No scleral icterus.  Neck: Normal range of motion. Neck supple.  Cardiovascular: Normal rate,  regular rhythm, normal heart sounds and intact distal pulses.  Exam reveals no gallop and no friction rub.   No murmur heard. Pulmonary/Chest: Effort normal and breath sounds normal. No respiratory distress. She has no wheezes. She has no rales. She exhibits no tenderness.  Musculoskeletal: Normal range of motion. She exhibits no edema or tenderness.  Lymphadenopathy:    She has no cervical adenopathy.  Neurological: She is alert and oriented to person, place, and time. Gait normal. GCS score is 15.  Skin: Skin is warm and dry. No rash noted. She is not diaphoretic. No  erythema. No pallor.  Psychiatric: Mood, memory, affect and judgment normal.  Nursing note and vitals reviewed.   Assessment/Plan: 1. Encounter to establish care Follow-up in 2 months for complete physical exam Needs to start exercising on a weekly basis  2. Essential hypertension - Ideally, I would like to have her off any blood pressure medications. Unfortunately right now though she is eating fairly healthy she is not exercising and her blood pressure continues to be elevated. -We'll increase lisinopril from milligrams to 20 mg - lisinopril (PRINIVIL,ZESTRIL) 20 MG tablet; Take 1 tablet (20 mg total) by mouth daily.  Dispense: 30 tablet; Refill: 3 -Follow-up in 2 weeks At that time we will consider changing medications if she continues to have a cough  3. Cough -I do not necessarily think that her cough is due to lisinopril. He does not necessarily want to change her blood pressure medication at this time. -We will trial using Flonase and over-the-counter allergy medicine such as Claritin. -When she follows up in 2 weeks. We will reassess the situation

## 2015-10-21 ENCOUNTER — Ambulatory Visit: Payer: Commercial Managed Care - HMO | Admitting: Adult Health

## 2015-12-01 ENCOUNTER — Ambulatory Visit: Payer: Commercial Managed Care - HMO | Admitting: Adult Health

## 2015-12-01 ENCOUNTER — Ambulatory Visit (INDEPENDENT_AMBULATORY_CARE_PROVIDER_SITE_OTHER): Payer: Commercial Managed Care - HMO | Admitting: Adult Health

## 2015-12-01 VITALS — BP 162/108 | Ht 61.0 in | Wt 155.0 lb

## 2015-12-01 DIAGNOSIS — I10 Essential (primary) hypertension: Secondary | ICD-10-CM

## 2015-12-01 LAB — CBC WITH DIFFERENTIAL/PLATELET
BASOS PCT: 0.5 % (ref 0.0–3.0)
Basophils Absolute: 0 10*3/uL (ref 0.0–0.1)
EOS PCT: 2 % (ref 0.0–5.0)
Eosinophils Absolute: 0.1 10*3/uL (ref 0.0–0.7)
HEMATOCRIT: 35.8 % — AB (ref 36.0–46.0)
HEMOGLOBIN: 11.6 g/dL — AB (ref 12.0–15.0)
LYMPHS PCT: 33.7 % (ref 12.0–46.0)
Lymphs Abs: 2.3 10*3/uL (ref 0.7–4.0)
MCHC: 32.3 g/dL (ref 30.0–36.0)
MCV: 82.4 fl (ref 78.0–100.0)
Monocytes Absolute: 0.5 10*3/uL (ref 0.1–1.0)
Monocytes Relative: 7.8 % (ref 3.0–12.0)
NEUTROS ABS: 3.8 10*3/uL (ref 1.4–7.7)
Neutrophils Relative %: 56 % (ref 43.0–77.0)
PLATELETS: 276 10*3/uL (ref 150.0–400.0)
RBC: 4.35 Mil/uL (ref 3.87–5.11)
RDW: 15.3 % (ref 11.5–15.5)
WBC: 6.7 10*3/uL (ref 4.0–10.5)

## 2015-12-01 LAB — BASIC METABOLIC PANEL
BUN: 18 mg/dL (ref 6–23)
CHLORIDE: 105 meq/L (ref 96–112)
CO2: 27 meq/L (ref 19–32)
CREATININE: 0.86 mg/dL (ref 0.40–1.20)
Calcium: 8.9 mg/dL (ref 8.4–10.5)
GFR: 92.19 mL/min (ref >60.00)
Glucose, Bld: 78 mg/dL (ref 70–99)
POTASSIUM: 3.4 meq/L — AB (ref 3.5–5.1)
Sodium: 138 mEq/L (ref 135–145)

## 2015-12-01 MED ORDER — LISINOPRIL 40 MG PO TABS: 40.0000 mg | ORAL_TABLET | Freq: Every day | ORAL | Status: DC

## 2015-12-01 MED ORDER — HYDROCHLOROTHIAZIDE 12.5 MG PO CAPS: 12.5000 mg | ORAL_CAPSULE | Freq: Every day | ORAL | Status: DC

## 2015-12-01 NOTE — Patient Instructions (Signed)
I have sent in a prescription for Lisinopril 40 mg and HCTZ 12.5 mg  When you get home tonight, taken an extra dose of Lisinopril 20 mg and when you get the HCTZ take a dose of that.   Tomorrow morning start off with 40 mg of Lisinopril and 12.5 mg HCTZ.   I will follow up with you regarding your labs, if anything is wrong.   Follow up with me in 2 weeks or sooner if needed

## 2015-12-01 NOTE — Progress Notes (Signed)
Subjective:    Patient ID: Lindsay [name], female    DOB: [birth year], 45 y.o.   MRN: [id number]  HPI  45 year old female who presents to the office today for hypertension. She was restarted on Lisinopril 10 mg back in December 2016. Since that time the highest her blood pressure as been in the 140/90's. She has been taking it periodically at which she reports that it has not been higher 150 systolic. When she woke up yesterday she had a migraine and fatigue, she took her blood pressure and it was 186/102. During the night her blood pressure dropped to 144/94.   This morning when she woke up it was 167/102. She continues to have fatigue and headaches.   Yesterday she reports that her vision was blurred.    BP Readings from Last 3 Encounters:  12/01/15 162/108  10/07/15 146/96  08/04/15 140/90   Wt Readings from Last 3 Encounters:  12/01/15 155 lb (70.308 kg)  10/07/15 154 lb (69.854 kg)  08/04/15 155 lb 1.6 oz (70.353 kg)     Review of Systems  Constitutional: Positive for activity change and fatigue.  Eyes: Positive for visual disturbance.  Respiratory: Negative.   Cardiovascular: Negative.   Neurological: Positive for headaches. Negative for dizziness, speech difficulty, weakness and light-headedness.  Hematological: Negative.   All other systems reviewed and are negative.  Past Medical History  Diagnosis Date  . Hypertension     Social History   Social History  . Marital Status: Married    Spouse Name: N/A  . Number of Children: N/A  . Years of Education: N/A   Occupational History  . Not on file.   Social History Main Topics  . Smoking status: Never Smoker   . Smokeless tobacco: Not on file  . Alcohol Use: No  . Drug Use: No  . Sexual Activity: Yes    Birth Control/ Protection: None   Other Topics Concern  . 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 Tubal ligation        Family History  Problem Relation Age of Onset  . Hypertension Father   . Lung cancer Mother     Mother was a smoker  . Diabetes Mellitus II Maternal Uncle     Allergies  Allergen Reactions  . Ibuprofen Hypertension    Current Outpatient Prescriptions on File Prior to Visit  Medication Sig Dispense Refill  . lisinopril (PRINIVIL,ZESTRIL) 20 MG tablet Take 1 tablet (20 mg total) by mouth daily. 30 tablet 3   No current facility-administered medications on file prior to visit.    BP 162/108 mmHg  Ht  (1.549 m)  Wt 155 lb (70.308 kg)  BMI 29.30 kg/m2        Objective:   Physical Exam  Constitutional: She is oriented to person, place, and time. She appears well-developed and well-nourished. No distress.  Cardiovascular: Normal rate, regular rhythm, normal heart sounds and intact distal pulses.  Exam reveals no gallop and no friction rub.   No murmur heard. Pulmonary/Chest: Effort normal and breath sounds normal. No respiratory distress. She has no wheezes. She has no rales. She exhibits no tenderness.  Abdominal: Soft. Bowel sounds are normal.  Musculoskeletal: Normal range of motion. She exhibits no edema or tenderness.  Neurological: She is alert and oriented to person, place, and time.  Skin: Skin is warm and  dry. No rash noted. She is not diaphoretic. No erythema. No pallor.  Psychiatric: She has a normal mood and affect. Her behavior is normal. Judgment and thought content normal.  Vitals reviewed.     Assessment & Plan:  1. Essential hypertension - Taken an extra 20 mg lisinopril tonight when you get home. You can also take a dose of the 12.5 of HCTZ. Start both medications tomorrow morning as your regular routine.  - continue to monitor BP at home.  - Bring log with you - lisinopril (PRINIVIL,ZESTRIL) 40 MG tablet; Take 1 tablet (40 mg total) by mouth daily.  Dispense: 90 tablet; Refill: 1 - hydrochlorothiazide (MICROZIDE) 12.5 MG capsule; Take 1 capsule  (12.5 mg total) by mouth daily.  Dispense: 30 capsule; Refill: 3 - Basic metabolic panel - CBC with Differential/Platelet - Follow up in 2 weeks or sooner if needed  Shirline Freesory Juston Goheen, NP

## 2015-12-02 ENCOUNTER — Telehealth: Payer: Self-pay | Admitting: Adult Health

## 2015-12-02 NOTE — Telephone Encounter (Signed)
Pt is returning brook call °

## 2015-12-02 NOTE — Telephone Encounter (Signed)
Patient notified of lab results

## 2015-12-15 ENCOUNTER — Ambulatory Visit (INDEPENDENT_AMBULATORY_CARE_PROVIDER_SITE_OTHER): Payer: Commercial Managed Care - HMO | Admitting: Adult Health

## 2015-12-15 ENCOUNTER — Encounter: Payer: Self-pay | Admitting: Adult Health

## 2015-12-15 VITALS — BP 132/90 | Temp 98.5°F | Ht 61.0 in | Wt 152.6 lb

## 2015-12-15 DIAGNOSIS — I10 Essential (primary) hypertension: Secondary | ICD-10-CM | POA: Diagnosis not present

## 2015-12-15 NOTE — Progress Notes (Signed)
Subjective:    Patient ID: Lindsay [name], female    DOB: 1971-06-24, 45 y.o.   MRN: [id number]  HPI  45 year old female who presents to the office today for follow up regarding hypertension.  I last saw her two weeks ago and at that time her blood pressure was 162/108. She was having blurred vision, headaches and was fatigued. I increased the lisinopril from 20 mg to 40 mg.   Today in the office she reports that her blood pressure was 132/90. She has not taken Lisinopril today. She denies any blurred vision, headaches, or fatigue. She is feeling much better and has no complaints.   She is working on diet and exercise and has lost 3 pounds over the last two weeks.   Review of Systems  Constitutional: Negative.   Respiratory: Negative.   Cardiovascular: Negative.   Gastrointestinal: Negative.   Neurological: Negative.   All other systems reviewed and are negative.  Past Medical History  Diagnosis Date  . Hypertension     Social History   Social History  . Marital Status: Married    Spouse Name: N/A  . Number of Children: N/A  . Years of Education: N/A   Occupational History  . Not on file.   Social History Main Topics  . Smoking status: Never Smoker   . Smokeless tobacco: Not on file  . Alcohol Use: No  . Drug Use: No  . Sexual Activity: Yes    Birth Control/ Protection: None   Other Topics Concern  . 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 Tubal ligation      Family History  Problem Relation Age of Onset  . Hypertension Father   . Lung cancer Mother     Mother was a smoker  . Diabetes Mellitus II Maternal Uncle     Allergies  Allergen Reactions  . Ibuprofen Hypertension    Current Outpatient Prescriptions on File Prior to Visit  Medication Sig Dispense Refill  . hydrochlorothiazide (MICROZIDE) 12.5 MG capsule Take 1 capsule (12.5 mg total) by mouth daily. 30  capsule 3  . lisinopril (PRINIVIL,ZESTRIL) 40 MG tablet Take 1 tablet (40 mg total) by mouth daily. 90 tablet 1   No current facility-administered medications on file prior to visit.    BP 132/90 mmHg  Temp(Src) 98.5 F (36.9 C) (Oral)  Ht  (1.549 m)  Wt 152 lb 9.6 oz (69.219 kg)  BMI 28.85 kg/m2       Objective:   Physical Exam  Constitutional: She is oriented to person, place, and time. She appears well-developed and well-nourished. No distress.  Cardiovascular: Normal rate, regular rhythm, normal heart sounds and intact distal pulses.  Exam reveals no gallop and no friction rub.   No murmur heard. Pulmonary/Chest: Effort normal and breath sounds normal. No respiratory distress. She has no wheezes. She has no rales. She exhibits no tenderness.  Neurological: She is alert and oriented to person, place, and time.  Skin: Skin is warm and dry. No rash noted. She is not diaphoretic. No erythema. No pallor.  Psychiatric: She has a normal mood and affect. Her behavior is normal. Judgment and thought content normal.  Nursing note and vitals reviewed.     Assessment & Plan:  1. Essential hypertension - Continue with current dose of medications.  - I would like her to monitor at home  and inform me if her blood pressures are above 140/90 on a consecutive basis.  - Follow up as needed  Shirline Freesory Brieonna Crutcher, NP

## 2016-02-11 ENCOUNTER — Encounter (HOSPITAL_COMMUNITY): Payer: Self-pay | Admitting: Emergency Medicine

## 2016-02-11 DIAGNOSIS — Z79899 Other long term (current) drug therapy: Secondary | ICD-10-CM | POA: Insufficient documentation

## 2016-02-11 DIAGNOSIS — K8000 Calculus of gallbladder with acute cholecystitis without obstruction: Secondary | ICD-10-CM | POA: Diagnosis not present

## 2016-02-11 DIAGNOSIS — Z6831 Body mass index (BMI) 31.0-31.9, adult: Secondary | ICD-10-CM | POA: Diagnosis not present

## 2016-02-11 DIAGNOSIS — I10 Essential (primary) hypertension: Secondary | ICD-10-CM | POA: Diagnosis not present

## 2016-02-11 DIAGNOSIS — R109 Unspecified abdominal pain: Secondary | ICD-10-CM | POA: Diagnosis present

## 2016-02-11 DIAGNOSIS — E669 Obesity, unspecified: Secondary | ICD-10-CM | POA: Diagnosis not present

## 2016-02-11 LAB — CBC
HEMATOCRIT: 36.2 % (ref 36.0–46.0)
HEMOGLOBIN: 11.9 g/dL — AB (ref 12.0–15.0)
MCH: 27.8 pg (ref 26.0–34.0)
MCHC: 32.9 g/dL (ref 30.0–36.0)
MCV: 84.6 fL (ref 78.0–100.0)
Platelets: 263 10*3/uL (ref 150–400)
RBC: 4.28 MIL/uL (ref 3.87–5.11)
RDW: 15 % (ref 11.5–15.5)
WBC: 5.8 10*3/uL (ref 4.0–10.5)

## 2016-02-11 LAB — COMPREHENSIVE METABOLIC PANEL
ALT: 17 U/L (ref 14–54)
ANION GAP: 5 (ref 5–15)
AST: 18 U/L (ref 15–41)
Albumin: 3.6 g/dL (ref 3.5–5.0)
Alkaline Phosphatase: 61 U/L (ref 38–126)
BUN: 13 mg/dL (ref 6–20)
CHLORIDE: 103 mmol/L (ref 101–111)
CO2: 29 mmol/L (ref 22–32)
Calcium: 9 mg/dL (ref 8.9–10.3)
Creatinine, Ser: 1.01 mg/dL — ABNORMAL HIGH (ref 0.44–1.00)
GFR calc non Af Amer: >60 mL/min (ref >60)
Glucose, Bld: 117 mg/dL — ABNORMAL HIGH (ref 65–99)
POTASSIUM: 3.6 mmol/L (ref 3.5–5.1)
SODIUM: 137 mmol/L (ref 135–145)
Total Bilirubin: 0.3 mg/dL (ref 0.3–1.2)
Total Protein: 7.3 g/dL (ref 6.5–8.1)

## 2016-02-11 LAB — LIPASE, BLOOD: Lipase: 57 U/L — ABNORMAL HIGH (ref 11–51)

## 2016-02-11 LAB — POC URINE PREG, ED: Preg Test, Ur: NEGATIVE

## 2016-02-11 NOTE — ED Triage Notes (Signed)
Patient reports RUQ pain with nausea , emesis and diarrhea onset this weekend , denies fever or chills.

## 2016-02-12 ENCOUNTER — Observation Stay (HOSPITAL_COMMUNITY): Payer: Commercial Managed Care - HMO

## 2016-02-12 ENCOUNTER — Emergency Department (HOSPITAL_COMMUNITY): Payer: Commercial Managed Care - HMO

## 2016-02-12 ENCOUNTER — Encounter (HOSPITAL_COMMUNITY): Payer: Self-pay | Admitting: General Practice

## 2016-02-12 ENCOUNTER — Observation Stay (HOSPITAL_COMMUNITY): Payer: Commercial Managed Care - HMO | Admitting: Certified Registered"

## 2016-02-12 ENCOUNTER — Observation Stay (HOSPITAL_COMMUNITY)
Admission: EM | Admit: 2016-02-12 | Discharge: 2016-02-13 | Disposition: A | Discharge status: Home / Self Care | Payer: Commercial Managed Care - HMO | Attending: General Surgery | Admitting: General Surgery

## 2016-02-12 ENCOUNTER — Encounter (HOSPITAL_COMMUNITY)
Admission: EM | Disposition: A | Discharge status: Home / Self Care | Payer: Self-pay | Source: Home / Self Care | Attending: Emergency Medicine

## 2016-02-12 DIAGNOSIS — K8000 Calculus of gallbladder with acute cholecystitis without obstruction: Secondary | ICD-10-CM | POA: Diagnosis present

## 2016-02-12 DIAGNOSIS — K810 Acute cholecystitis: Secondary | ICD-10-CM

## 2016-02-12 HISTORY — PX: LAPAROSCOPIC CHOLECYSTECTOMY: SUR755

## 2016-02-12 HISTORY — PX: CHOLECYSTECTOMY: SHX55

## 2016-02-12 HISTORY — DX: Iron deficiency anemia, unspecified: D50.9

## 2016-02-12 LAB — URINALYSIS, ROUTINE W REFLEX MICROSCOPIC
Bilirubin Urine: NEGATIVE
Glucose, UA: NEGATIVE mg/dL
Ketones, ur: NEGATIVE mg/dL
Leukocytes, UA: NEGATIVE
Nitrite: NEGATIVE
PH: 7 (ref 5.0–8.0)
Protein, ur: NEGATIVE mg/dL
SPECIFIC GRAVITY, URINE: 1.018 (ref 1.005–1.030)

## 2016-02-12 LAB — URINE MICROSCOPIC-ADD ON

## 2016-02-12 LAB — SURGICAL PCR SCREEN
MRSA, PCR: NEGATIVE
Staphylococcus aureus: NEGATIVE

## 2016-02-12 SURGERY — LAPAROSCOPIC CHOLECYSTECTOMY WITH INTRAOPERATIVE CHOLANGIOGRAM
Anesthesia: General | Site: Abdomen

## 2016-02-12 MED ORDER — ONDANSETRON HCL 4 MG/2ML IJ SOLN
INTRAMUSCULAR | Status: AC
  Filled 2016-02-12: qty 2

## 2016-02-12 MED ORDER — HYDROMORPHONE HCL 1 MG/ML IJ SOLN
INTRAMUSCULAR | Status: AC
  Filled 2016-02-12: qty 1

## 2016-02-12 MED ORDER — ROCURONIUM BROMIDE 100 MG/10ML IV SOLN
INTRAVENOUS | Status: DC | PRN
  Administered 2016-02-12: 40 mg via INTRAVENOUS

## 2016-02-12 MED ORDER — MORPHINE SULFATE (PF) 2 MG/ML IV SOLN
2.0000 mg | INTRAVENOUS | Status: DC | PRN
  Administered 2016-02-12: 4 mg via INTRAVENOUS
  Filled 2016-02-12: qty 2

## 2016-02-12 MED ORDER — PROMETHAZINE HCL 25 MG/ML IJ SOLN: 6.2500 mg | INTRAMUSCULAR | Status: DC | PRN

## 2016-02-12 MED ORDER — SUGAMMADEX SODIUM 200 MG/2ML IV SOLN
INTRAVENOUS | Status: AC
  Filled 2016-02-12: qty 2

## 2016-02-12 MED ORDER — SUGAMMADEX SODIUM 200 MG/2ML IV SOLN
INTRAVENOUS | Status: DC | PRN
  Administered 2016-02-12: 200 mg via INTRAVENOUS

## 2016-02-12 MED ORDER — BUPIVACAINE-EPINEPHRINE (PF) 0.25% -1:200000 IJ SOLN
INTRAMUSCULAR | Status: AC
  Filled 2016-02-12: qty 30

## 2016-02-12 MED ORDER — LIDOCAINE 2% (20 MG/ML) 5 ML SYRINGE
INTRAMUSCULAR | Status: AC
  Filled 2016-02-12: qty 5

## 2016-02-12 MED ORDER — HYDRALAZINE HCL 20 MG/ML IJ SOLN: 10.0000 mg | INTRAMUSCULAR | Status: DC | PRN

## 2016-02-12 MED ORDER — HYDROMORPHONE HCL 1 MG/ML IJ SOLN
1.0000 mg | Freq: Once | INTRAMUSCULAR | Status: AC
  Administered 2016-02-12: 1 mg via INTRAVENOUS
  Filled 2016-02-12: qty 1

## 2016-02-12 MED ORDER — HYDROMORPHONE HCL 1 MG/ML IJ SOLN
0.2500 mg | INTRAMUSCULAR | Status: DC | PRN
  Administered 2016-02-12: 0.5 mg via INTRAVENOUS

## 2016-02-12 MED ORDER — SODIUM CHLORIDE 0.9 % IR SOLN
Status: DC | PRN
  Administered 2016-02-12: 1000 mL

## 2016-02-12 MED ORDER — SODIUM CHLORIDE 0.9 % IV BOLUS (SEPSIS): 1000.0000 mL | Freq: Once | INTRAVENOUS | Status: DC

## 2016-02-12 MED ORDER — SODIUM CHLORIDE 0.9 % IV SOLN
INTRAVENOUS | Status: DC | PRN
  Administered 2016-02-12: 6 mL

## 2016-02-12 MED ORDER — PROPOFOL 10 MG/ML IV BOLUS
INTRAVENOUS | Status: DC | PRN
  Administered 2016-02-12: 150 mg via INTRAVENOUS

## 2016-02-12 MED ORDER — LISINOPRIL 40 MG PO TABS
40.0000 mg | ORAL_TABLET | Freq: Every day | ORAL | Status: DC
  Administered 2016-02-12 – 2016-02-13 (×2): 40 mg via ORAL
  Filled 2016-02-12 (×2): qty 1

## 2016-02-12 MED ORDER — LABETALOL HCL 5 MG/ML IV SOLN
INTRAVENOUS | Status: AC
  Filled 2016-02-12: qty 4

## 2016-02-12 MED ORDER — PROPOFOL 10 MG/ML IV BOLUS
INTRAVENOUS | Status: AC
  Filled 2016-02-12: qty 20

## 2016-02-12 MED ORDER — FENTANYL CITRATE (PF) 250 MCG/5ML IJ SOLN
INTRAMUSCULAR | Status: DC | PRN
  Administered 2016-02-12 (×3): 50 ug via INTRAVENOUS
  Administered 2016-02-12: 100 ug via INTRAVENOUS
  Administered 2016-02-12 (×3): 50 ug via INTRAVENOUS

## 2016-02-12 MED ORDER — IOPAMIDOL (ISOVUE-300) INJECTION 61%
INTRAVENOUS | Status: AC
  Filled 2016-02-12: qty 50

## 2016-02-12 MED ORDER — ACETAMINOPHEN 650 MG RE SUPP: 650.0000 mg | Freq: Four times a day (QID) | RECTAL | Status: DC | PRN

## 2016-02-12 MED ORDER — MIDAZOLAM HCL 5 MG/5ML IJ SOLN
INTRAMUSCULAR | Status: DC | PRN
  Administered 2016-02-12: 2 mg via INTRAVENOUS

## 2016-02-12 MED ORDER — ONDANSETRON HCL 4 MG/2ML IJ SOLN
4.0000 mg | Freq: Once | INTRAMUSCULAR | Status: AC
  Administered 2016-02-12: 4 mg via INTRAVENOUS
  Filled 2016-02-12: qty 2

## 2016-02-12 MED ORDER — DIPHENHYDRAMINE HCL 12.5 MG/5ML PO ELIX: 12.5000 mg | ORAL_SOLUTION | Freq: Four times a day (QID) | ORAL | Status: DC | PRN

## 2016-02-12 MED ORDER — ROCURONIUM BROMIDE 50 MG/5ML IV SOLN
INTRAVENOUS | Status: AC
  Filled 2016-02-12: qty 1

## 2016-02-12 MED ORDER — BUPIVACAINE-EPINEPHRINE 0.25% -1:200000 IJ SOLN
INTRAMUSCULAR | Status: DC | PRN
  Administered 2016-02-12: 15 mL

## 2016-02-12 MED ORDER — 0.9 % SODIUM CHLORIDE (POUR BTL) OPTIME
TOPICAL | Status: DC | PRN
  Administered 2016-02-11: 1000 mL

## 2016-02-12 MED ORDER — HYDROCHLOROTHIAZIDE 12.5 MG PO CAPS
12.5000 mg | ORAL_CAPSULE | Freq: Every day | ORAL | Status: DC
  Administered 2016-02-12 – 2016-02-13 (×2): 12.5 mg via ORAL
  Filled 2016-02-12 (×2): qty 1

## 2016-02-12 MED ORDER — MIDAZOLAM HCL 2 MG/2ML IJ SOLN
INTRAMUSCULAR | Status: AC
  Filled 2016-02-12: qty 2

## 2016-02-12 MED ORDER — ONDANSETRON HCL 4 MG/2ML IJ SOLN
4.0000 mg | Freq: Four times a day (QID) | INTRAMUSCULAR | Status: DC | PRN
  Administered 2016-02-12 (×2): 4 mg via INTRAVENOUS
  Filled 2016-02-12 (×2): qty 2

## 2016-02-12 MED ORDER — HYDRALAZINE HCL 20 MG/ML IJ SOLN
5.0000 mg | INTRAMUSCULAR | Status: DC | PRN
  Administered 2016-02-12 (×3): 5 mg via INTRAVENOUS

## 2016-02-12 MED ORDER — LABETALOL HCL 5 MG/ML IV SOLN
INTRAVENOUS | Status: DC | PRN
  Administered 2016-02-12 (×4): 5 mg via INTRAVENOUS

## 2016-02-12 MED ORDER — MEPERIDINE HCL 25 MG/ML IJ SOLN: 6.2500 mg | INTRAMUSCULAR | Status: DC | PRN

## 2016-02-12 MED ORDER — LACTATED RINGERS IV SOLN
INTRAVENOUS | Status: DC
  Administered 2016-02-12 (×2): via INTRAVENOUS

## 2016-02-12 MED ORDER — LABETALOL HCL 5 MG/ML IV SOLN
10.0000 mg | Freq: Once | INTRAVENOUS | Status: AC
  Administered 2016-02-12: 10 mg via INTRAVENOUS
  Filled 2016-02-12: qty 4

## 2016-02-12 MED ORDER — LIDOCAINE HCL (CARDIAC) 20 MG/ML IV SOLN
INTRAVENOUS | Status: DC | PRN
  Administered 2016-02-12: 50 mg via INTRAVENOUS

## 2016-02-12 MED ORDER — FENTANYL CITRATE (PF) 250 MCG/5ML IJ SOLN
INTRAMUSCULAR | Status: AC
  Filled 2016-02-12: qty 5

## 2016-02-12 MED ORDER — HYDROMORPHONE HCL 1 MG/ML IJ SOLN
0.2500 mg | INTRAMUSCULAR | Status: DC | PRN
Start: 2016-02-12 — End: 2016-02-12
  Administered 2016-02-12 (×2): 0.25 mg via INTRAVENOUS
  Administered 2016-02-12: 0.5 mg via INTRAVENOUS

## 2016-02-12 MED ORDER — SODIUM CHLORIDE 0.9 % IV SOLN
INTRAVENOUS | Status: DC
  Administered 2016-02-12 (×2): via INTRAVENOUS

## 2016-02-12 MED ORDER — DEXTROSE 5 % IV SOLN
2.0000 g | Freq: Every day | INTRAVENOUS | Status: DC
  Administered 2016-02-12 – 2016-02-13 (×2): 2 g via INTRAVENOUS
  Filled 2016-02-12 (×3): qty 2

## 2016-02-12 MED ORDER — METOCLOPRAMIDE HCL 5 MG/ML IJ SOLN
10.0000 mg | Freq: Once | INTRAMUSCULAR | Status: AC
  Administered 2016-02-12: 10 mg via INTRAVENOUS
  Filled 2016-02-12: qty 2

## 2016-02-12 MED ORDER — ONDANSETRON 4 MG PO TBDP: 4.0000 mg | ORAL_TABLET | Freq: Four times a day (QID) | ORAL | Status: DC | PRN

## 2016-02-12 MED ORDER — ACETAMINOPHEN 325 MG PO TABS
650.0000 mg | ORAL_TABLET | Freq: Four times a day (QID) | ORAL | Status: DC | PRN
  Administered 2016-02-12 – 2016-02-13 (×3): 650 mg via ORAL
  Filled 2016-02-12 (×3): qty 2

## 2016-02-12 MED ORDER — OXYCODONE HCL 5 MG PO TABS
5.0000 mg | ORAL_TABLET | ORAL | Status: DC | PRN
  Administered 2016-02-12 – 2016-02-13 (×5): 10 mg via ORAL
  Filled 2016-02-12 (×5): qty 2

## 2016-02-12 MED ORDER — DEXAMETHASONE SODIUM PHOSPHATE 10 MG/ML IJ SOLN
INTRAMUSCULAR | Status: AC
  Filled 2016-02-12: qty 1

## 2016-02-12 MED ORDER — DIPHENHYDRAMINE HCL 50 MG/ML IJ SOLN: 12.5000 mg | Freq: Four times a day (QID) | INTRAMUSCULAR | Status: DC | PRN

## 2016-02-12 MED ORDER — HYDRALAZINE HCL 20 MG/ML IJ SOLN
INTRAMUSCULAR | Status: AC
  Filled 2016-02-12: qty 1

## 2016-02-12 MED ORDER — ONDANSETRON HCL 4 MG/2ML IJ SOLN
INTRAMUSCULAR | Status: DC | PRN
  Administered 2016-02-12: 4 mg via INTRAVENOUS

## 2016-02-12 MED ORDER — DEXAMETHASONE SODIUM PHOSPHATE 10 MG/ML IJ SOLN
INTRAMUSCULAR | Status: DC | PRN
  Administered 2016-02-12: 10 mg via INTRAVENOUS

## 2016-02-12 SURGICAL SUPPLY — 46 items
APL SKNCLS STERI-STRIP NONHPOA (GAUZE/BANDAGES/DRESSINGS) ×1
APPLIER CLIP ROT 10 11.4 M/L (STAPLE) ×3
APR CLP MED LRG 11.4X10 (STAPLE) ×1
BAG SPEC RTRVL LRG 6X4 10 (ENDOMECHANICALS) ×1
BENZOIN TINCTURE PRP APPL 2/3 (GAUZE/BANDAGES/DRESSINGS) ×3 IMPLANT
BLADE SURG ROTATE 9660 (MISCELLANEOUS) ×2 IMPLANT
CANISTER SUCTION 2500CC (MISCELLANEOUS) ×3 IMPLANT
CHLORAPREP W/TINT 26ML (MISCELLANEOUS) ×3 IMPLANT
CLIP APPLIE ROT 10 11.4 M/L (STAPLE) ×1 IMPLANT
CLOSURE STERI-STRIP 1/2X4 (GAUZE/BANDAGES/DRESSINGS) ×1
CLOSURE WOUND 1/2 X4 (GAUZE/BANDAGES/DRESSINGS) ×1
CLSR STERI-STRIP ANTIMIC 1/2X4 (GAUZE/BANDAGES/DRESSINGS) ×1 IMPLANT
COVER MAYO STAND STRL (DRAPES) ×3 IMPLANT
COVER SURGICAL LIGHT HANDLE (MISCELLANEOUS) ×3 IMPLANT
DRAPE C-ARM 42X72 X-RAY (DRAPES) ×3 IMPLANT
DRSG TEGADERM 2-3/8X2-3/4 SM (GAUZE/BANDAGES/DRESSINGS) ×9 IMPLANT
DRSG TEGADERM 4X4.75 (GAUZE/BANDAGES/DRESSINGS) ×3 IMPLANT
ELECT REM PT RETURN 9FT ADLT (ELECTROSURGICAL) ×3
ELECTRODE REM PT RTRN 9FT ADLT (ELECTROSURGICAL) ×1 IMPLANT
FILTER SMOKE EVAC LAPAROSHD (FILTER) ×1 IMPLANT
GAUZE SPONGE 2X2 8PLY STRL LF (GAUZE/BANDAGES/DRESSINGS) ×1 IMPLANT
GLOVE BIO SURGEON STRL SZ7 (GLOVE) ×3 IMPLANT
GLOVE BIOGEL PI IND STRL 7.5 (GLOVE) ×1 IMPLANT
GLOVE BIOGEL PI INDICATOR 7.5 (GLOVE) ×2
GOWN STRL REUS W/ TWL LRG LVL3 (GOWN DISPOSABLE) ×3 IMPLANT
GOWN STRL REUS W/TWL LRG LVL3 (GOWN DISPOSABLE) ×9
KIT BASIN OR (CUSTOM PROCEDURE TRAY) ×3 IMPLANT
KIT ROOM TURNOVER OR (KITS) ×3 IMPLANT
NS IRRIG 1000ML POUR BTL (IV SOLUTION) ×3 IMPLANT
PAD ARMBOARD 7.5X6 YLW CONV (MISCELLANEOUS) ×3 IMPLANT
POUCH SPECIMEN RETRIEVAL 10MM (ENDOMECHANICALS) ×3 IMPLANT
SCISSORS LAP 5X35 DISP (ENDOMECHANICALS) ×3 IMPLANT
SET CHOLANGIOGRAPH 5 50 .035 (SET/KITS/TRAYS/PACK) ×3 IMPLANT
SET IRRIG TUBING LAPAROSCOPIC (IRRIGATION / IRRIGATOR) ×3 IMPLANT
SLEEVE ENDOPATH XCEL 5M (ENDOMECHANICALS) ×3 IMPLANT
SPECIMEN JAR SMALL (MISCELLANEOUS) ×3 IMPLANT
SPONGE GAUZE 2X2 STER 10/PKG (GAUZE/BANDAGES/DRESSINGS) ×2
STRIP CLOSURE SKIN 1/2X4 (GAUZE/BANDAGES/DRESSINGS) ×2 IMPLANT
SUT MNCRL AB 4-0 PS2 18 (SUTURE) ×5 IMPLANT
TOWEL OR 17X24 6PK STRL BLUE (TOWEL DISPOSABLE) ×3 IMPLANT
TOWEL OR 17X26 10 PK STRL BLUE (TOWEL DISPOSABLE) ×1 IMPLANT
TRAY LAPAROSCOPIC MC (CUSTOM PROCEDURE TRAY) ×3 IMPLANT
TROCAR XCEL BLUNT TIP 100MML (ENDOMECHANICALS) ×3 IMPLANT
TROCAR XCEL NON-BLD 11X100MML (ENDOMECHANICALS) ×3 IMPLANT
TROCAR XCEL NON-BLD 5MMX100MML (ENDOMECHANICALS) ×3 IMPLANT
TUBING INSUFFLATION (TUBING) ×3 IMPLANT

## 2016-02-12 NOTE — Anesthesia Postprocedure Evaluation (Signed)
Anesthesia Post Note  Patient: Solicitor  Procedure(s) Performed: Procedure(s) (LRB): LAPAROSCOPIC CHOLECYSTECTOMY WITH INTRAOPERATIVE CHOLANGIOGRAM (N/A)  Patient location during evaluation: PACU Anesthesia Type: General Level of consciousness: awake and alert Pain management: pain level controlled Vital Signs Assessment: post-procedure vital signs reviewed and stable Respiratory status: spontaneous breathing, nonlabored ventilation, respiratory function stable and patient connected to nasal cannula oxygen Cardiovascular status: blood pressure returned to baseline and stable Postop Assessment: no signs of nausea or vomiting Anesthetic complications: no    Last Vitals:  Vitals:   02/12/16 1304 02/12/16 1430  BP:  (!) 123/97  Pulse:  95  Resp:  18  Temp: 36.9 C 37.4 C    Last Pain:  Vitals:   02/12/16 1430  TempSrc: Oral  PainSc:                  Kennieth Rad

## 2016-02-12 NOTE — Transfer of Care (Signed)
Immediate Anesthesia Transfer of Care Note  Patient: Lindsay [name]  Procedure(s) Performed: Procedure(s): LAPAROSCOPIC CHOLECYSTECTOMY WITH INTRAOPERATIVE CHOLANGIOGRAM (N/A)  Patient Location: PACU  Anesthesia Type:General  Level of Consciousness: awake, alert , oriented and patient cooperative  Airway & Oxygen Therapy: Patient Spontanous Breathing and Patient connected to nasal cannula oxygen  Post-op Assessment: Report given to RN, Post -op Vital signs reviewed and stable and Patient moving all extremities  Post vital signs: Reviewed and stable  Last Vitals:  Vitals:   02/12/16 0644 02/12/16 0817  BP: 115/72 (!) 153/95  Pulse: 64 (!) 57  Resp:  18  Temp: 37.2 C 37.3 C    Last Pain:  Vitals:   02/12/16 0817  TempSrc: Oral  PainSc:          Complications: No apparent anesthesia complications

## 2016-02-12 NOTE — H&P (Addendum)
Lindsay [name] is an 45 y.o. female.   Chief Complaint: abdominal pain HPI: 45 yo female with 4 days of right upper and epigastric abdominal pain. She does not note any strange foods and states she had a burger and fries before it came on. The pain worsened in intensity over the 4 days and is now unbearable. She has had no previous episodes before this week. She has had nausea and vomiting the last few days, no diarrhea or constipation.  History of lap appy and ectopic pregancy surgery  Past Medical History:  Diagnosis Date  . Hypertension     Past Surgical History:  Procedure Laterality Date  . TUBAL LIGATION      Family History  Problem Relation Age of Onset  . Hypertension Father   . Lung cancer Mother     Mother was a smoker  . Diabetes Mellitus II Maternal Uncle    Social History:  reports that she has never smoked. She does not have any smokeless tobacco history on file. She reports that she does not drink alcohol or use drugs.  Allergies:  Allergies  Allergen Reactions  . Ibuprofen Hypertension     (Not in a hospital admission)  Results for orders placed or performed during the hospital encounter of 02/12/16 (from the past 48 hour(s))  Urinalysis, Routine w reflex microscopic     Status: Abnormal   Collection Time: 02/11/16 11:07 PM  Result Value Ref Range   Color, Urine YELLOW YELLOW   APPearance CLEAR CLEAR   Specific Gravity, Urine 1.018 1.005 - 1.030   pH 7.0 5.0 - 8.0   Glucose, UA NEGATIVE NEGATIVE mg/dL   Hgb urine dipstick LARGE (A) NEGATIVE   Bilirubin Urine NEGATIVE NEGATIVE   Ketones, ur NEGATIVE NEGATIVE mg/dL   Protein, ur NEGATIVE NEGATIVE mg/dL   Nitrite NEGATIVE NEGATIVE   Leukocytes, UA NEGATIVE NEGATIVE  Urine microscopic-add on     Status: Abnormal   Collection Time: 02/11/16 11:07 PM  Result Value Ref Range   Squamous Epithelial / LPF 0-5 (A) NONE SEEN   WBC, UA 0-5 0 - 5 WBC/hpf   RBC / HPF 6-30 0 - 5 RBC/hpf   Bacteria, UA FEW (A)  NONE SEEN   Casts HYALINE CASTS (A) NEGATIVE  Lipase, blood     Status: Abnormal   Collection Time: 02/11/16 11:08 PM  Result Value Ref Range   Lipase 57 (H) 11 - 51 U/L  Comprehensive metabolic panel     Status: Abnormal   Collection Time: 02/11/16 11:08 PM  Result Value Ref Range   Sodium 137 135 - 145 mmol/L   Potassium 3.6 3.5 - 5.1 mmol/L   Chloride 103 101 - 111 mmol/L   CO2 29 22 - 32 mmol/L   Glucose, Bld 117 (H) 65 - 99 mg/dL   BUN 13 6 - 20 mg/dL   Creatinine, Ser 1.01 (H) 0.44 - 1.00 mg/dL   Calcium 9.0 8.9 - 10.3 mg/dL   Total Protein 7.3 6.5 - 8.1 g/dL   Albumin 3.6 3.5 - 5.0 g/dL   AST 18 15 - 41 U/L   ALT 17 14 - 54 U/L   Alkaline Phosphatase 61 38 - 126 U/L   Total Bilirubin 0.3 0.3 - 1.2 mg/dL   GFR calc non Af Amer >60 >60 mL/min   GFR calc Af Amer >60 >60 mL/min    Comment: (NOTE) The eGFR has been calculated using the CKD EPI equation. This calculation has not been validated  in all clinical situations. eGFR's persistently <60 mL/min signify possible Chronic Kidney Disease.    Anion gap 5 5 - 15  CBC     Status: Abnormal   Collection Time: 02/11/16 11:08 PM  Result Value Ref Range   WBC 5.8 4.0 - 10.5 K/uL   RBC 4.28 3.87 - 5.11 MIL/uL   Hemoglobin 11.9 (L) 12.0 - 15.0 g/dL   HCT 36.2 36.0 - 46.0 %   MCV 84.6 78.0 - 100.0 fL   MCH 27.8 26.0 - 34.0 pg   MCHC 32.9 30.0 - 36.0 g/dL   RDW 15.0 11.5 - 15.5 %   Platelets 263 150 - 400 K/uL  POC urine preg, ED     Status: None   Collection Time: 02/11/16 11:42 PM  Result Value Ref Range   Preg Test, Ur NEGATIVE NEGATIVE    Comment:        THE SENSITIVITY OF THIS METHODOLOGY IS >24 mIU/mL    Dg Ribs Unilateral W/chest Right  Result Date: 02/12/2016 CLINICAL DATA:  45 year old female with right anterior rib pain EXAM: RIGHT RIBS AND CHEST - 3+ VIEW COMPARISON:  None. FINDINGS: No fracture or other bone lesions are seen involving the ribs. There is no evidence of pneumothorax or pleural effusion. Both  lungs are clear. Heart size and mediastinal contours are within normal limits. IMPRESSION: No rib fracture or pneumothorax. Electronically Signed   By: Anner Crete M.D.   On: 02/12/2016 01:57  US Abdomen Limited Ruq  Result Date: 02/12/2016 CLINICAL DATA:  Gradual onset of right upper quadrant abdominal pain for the past 6 days. EXAM: US ABDOMEN LIMITED - RIGHT UPPER QUADRANT COMPARISON:  CT abdomen pelvis - 12/15/2009 FINDINGS: Gallbladder: There is a approximately 1.8 cm echogenic shadowing stone lodged within the neck of the gallbladder. There is a minimal amount of gallbladder wall thickening involving the fundus (image 7) with suspected minimal amount of pericholecystic fluid. This sonographer reported the patient experienced pain with sonographic evaluation of the gallbladder (sonographic Murphy's sign). Common bile duct: Diameter: Normal in size measuring 2 mm in diameter Liver: Homogeneous hepatic echotexture. No discrete hepatic lesions. No definite evidence of intrahepatic biliary ductal dilatation. No ascites. IMPRESSION: Cholelithiasis with findings worrisome for early acute cholecystitis. If the clinical diagnosis remains in question, further evaluation with nuclear medicine HIDA scan could be performed as indicated. Electronically Signed   By: Sandi Mariscal M.D.   On: 02/12/2016 02:08   Review of Systems  Constitutional: Negative for chills and fever.  HENT: Negative for hearing loss.   Eyes: Negative for blurred vision and double vision.  Respiratory: Negative for cough and hemoptysis.   Cardiovascular: Negative for chest pain and palpitations.  Gastrointestinal: Positive for abdominal pain, nausea and vomiting.  Genitourinary: Negative for dysuria and urgency.  Musculoskeletal: Negative for myalgias and neck pain.  Skin: Negative for itching and rash.  Neurological: Negative for dizziness, tingling and headaches.  Endo/Heme/Allergies: Does not bruise/bleed easily.   Psychiatric/Behavioral: Negative for depression and suicidal ideas.    Blood pressure (!) 156/103, pulse 71, temperature 98.4 F (36.9 C), temperature source Oral, resp. rate 22, last menstrual period 02/10/2016, SpO2 99 %. Physical Exam  Vitals reviewed. Constitutional: She is oriented to person, place, and time. She appears well-developed and well-nourished.  HENT:  Head: Normocephalic and atraumatic.  Eyes: Conjunctivae and EOM are normal. Pupils are equal, round, and reactive to light.  Neck: Normal range of motion. Neck supple.  Cardiovascular: Normal rate and regular rhythm.  Respiratory: Effort normal and breath sounds normal.  GI: Soft. Bowel sounds are normal. She exhibits no distension. There is tenderness in the right upper quadrant.  Musculoskeletal: Normal range of motion.  Neurological: She is alert and oriented to person, place, and time.  Skin: Skin is warm and dry.  Psychiatric: She has a normal mood and affect. Her behavior is normal.     Assessment/Plan 45 yo female with ultrasound findings concerning for acute cholecystitis. Given the duration of the pain and US findings I do not think she is a candidate for follow up as outpatient. I discussed the etiology of gallstones and how they cause pain. I discussed the standard treatment of gallbladder removal and discussed details of the surgery, that it is performed laparoscopically with general anesthesia, that the gallbladder is dissected free of the common bile duct and liver, that the cystic duct is identified and clipped and cut, that occasionally the area is inflamed and cholangiography is performed for identification, that open surgery is sometimes needed, that bleeding and infection are low risks, that bile leak is a rare and treatable risk, and about post-cholecystectomy syndrome. She showed understanding and wanted to proceed as planned. -admit to surgical floor -IV abx -IV fluid bolus -NPO -pain control -plan  for lap chole in am  Mickeal Skinner, MD 02/12/2016, 4:33 AM

## 2016-02-12 NOTE — ED Notes (Signed)
Report called on to nurse on 6N- Nurse informed me bed is not ready and will call when pt is available to be transported

## 2016-02-12 NOTE — Op Note (Signed)
Laparoscopic Cholecystectomy with IOC Procedure Note  Indications: This patient presents with symptomatic gallbladder disease and will undergo laparoscopic cholecystectomy.  Pre-operative Diagnosis: Calculus of gallbladder with acute cholecystitis, without mention of obstruction  Post-operative Diagnosis: Same  Surgeon: Mica Ramdass K.   Assistants: none  Anesthesia: General endotracheal anesthesia  ASA Class: 2  Procedure Details  The patient was seen again in the Holding Room. The risks, benefits, complications, treatment options, and expected outcomes were discussed with the patient. The possibilities of reaction to medication, pulmonary aspiration, perforation of viscus, bleeding, recurrent infection, finding a normal gallbladder, the need for additional procedures, failure to diagnose a condition, the possible need to convert to an open procedure, and creating a complication requiring transfusion or operation were discussed with the patient. The likelihood of improving the patient's symptoms with return to their baseline status is good.  The patient and/or family concurred with the proposed plan, giving informed consent. The site of surgery properly noted. The patient was taken to Operating Room, identified as Lindsay [name] and the procedure verified as Laparoscopic Cholecystectomy with Intraoperative Cholangiogram. A Time Out was held and the above information confirmed.  Prior to the induction of general anesthesia, antibiotic prophylaxis was administered. General endotracheal anesthesia was then administered and tolerated well. After the induction, the abdomen was prepped with Chloraprep and draped in the sterile fashion. The patient was positioned in the supine position.  Local anesthetic agent was injected into the skin near the umbilicus and an incision made. We dissected down to the abdominal fascia with blunt dissection.  The fascia was incised vertically and we entered the  peritoneal cavity bluntly.  A pursestring suture of 0-Vicryl was placed around the fascial opening.  The Hasson cannula was inserted and secured with the stay suture.  Pneumoperitoneum was then created with CO2 and tolerated well without any adverse changes in the patient's vital signs. An 11-mm port was placed in the subxiphoid position.  Two 5-mm ports were placed in the right upper quadrant. All skin incisions were infiltrated with a local anesthetic agent before making the incision and placing the trocars.   We positioned the patient in reverse Trendelenburg, tilted slightly to the patient's left.  The gallbladder was identified, the fundus grasped and retracted cephalad. Interestingly, the fundus of the gallbladder protrudes through the front of the liver.  The gallbladder is thickened and edematous. Adhesions were lysed bluntly and with the electrocautery where indicated, taking care not to injure any adjacent organs or viscus. The infundibulum was grasped and retracted laterally, exposing the peritoneum overlying the triangle of Calot. This was then divided and exposed in a blunt fashion. A critical view of the cystic duct and cystic artery was obtained.  The cystic duct was clearly identified and bluntly dissected circumferentially. The cystic duct was ligated with a clip distally.   An incision was made in the cystic duct and the [facility] cholangiogram catheter introduced. The catheter was secured using a clip. A cholangiogram was then obtained which showed good visualization of the distal and proximal biliary tree with no sign of filling defects or obstruction.  Contrast flowed easily into the duodenum. The catheter was then removed.   The cystic duct was then ligated with clips and divided. The cystic artery was identified, dissected free, ligated with clips and divided as well.   The gallbladder was dissected from the liver bed in retrograde fashion with the electrocautery. The gallbladder was removed  and placed in an Endocatch sac. The liver bed was  irrigated and inspected. Hemostasis was achieved with the electrocautery and Surgicel SNOW. Copious irrigation was utilized and was repeatedly aspirated until clear.  The gallbladder and Endocatch sac were then removed through the umbilical port site.  The pursestring suture was used to close the umbilical fascia.    We again inspected the right upper quadrant for hemostasis.  Pneumoperitoneum was released as we removed the trocars.  4-0 Monocryl was used to close the skin.   Benzoin, steri-strips, and clean dressings were applied. The patient was then extubated and brought to the recovery room in stable condition. Instrument, sponge, and needle counts were correct at closure and at the conclusion of the case.   Findings: Cholecystitis with Cholelithiasis  Estimated Blood Loss: Minimal         Drains: none         Specimens: Gallbladder           Complications: None; patient tolerated the procedure well.         Disposition: PACU - hemodynamically stable.         Condition: stable  Wilmon Arms. Corliss Skains, MD, [facility] Surgery  General/ Trauma Surgery  02/12/2016 10:51 AM

## 2016-02-12 NOTE — Anesthesia Preprocedure Evaluation (Signed)
Anesthesia Evaluation  Patient identified by MRN, date of birth, ID band Patient awake    Reviewed: Allergy & Precautions, H&P , NPO status , Patient's Chart, lab work & pertinent test results  Airway Mallampati: I  TM Distance: >3 FB Neck ROM: full    Dental  (+) Teeth Intact   Pulmonary neg pulmonary ROS,    Pulmonary exam normal        Cardiovascular negative cardio ROS Normal cardiovascular exam Rhythm:regular Rate:Normal     Neuro/Psych negative neurological ROS  negative psych ROS   GI/Hepatic negative GI ROS, Neg liver ROS,   Endo/Other  negative endocrine ROS  Renal/GU negative Renal ROS     Musculoskeletal   Abdominal (+) + obese,   Peds  Hematology negative hematology ROS (+)   Anesthesia Other Findings   Reproductive/Obstetrics negative OB ROS                             Anesthesia Physical Anesthesia Plan  ASA: II  Anesthesia Plan: General   Post-op Pain Management:    Induction: Intravenous  Airway Management Planned: Oral ETT  Additional Equipment:   Intra-op Plan:   Post-operative Plan: Extubation in OR  Informed Consent: I have reviewed the patients History and Physical, chart, labs and discussed the procedure including the risks, benefits and alternatives for the proposed anesthesia with the patient or authorized representative who has indicated his/her understanding and acceptance.   Dental Advisory Given and Dental advisory given  Plan Discussed with: CRNA, Surgeon and Anesthesiologist  Anesthesia Plan Comments:         Anesthesia Quick Evaluation

## 2016-02-12 NOTE — Anesthesia Procedure Notes (Signed)
Procedure Name: Intubation Date/Time: 02/12/2016 9:43 AM Performed by: Lucinda Dell Pre-anesthesia Checklist: Patient identified, Emergency Drugs available, Suction available and Patient being monitored Patient Re-evaluated:Patient Re-evaluated prior to inductionOxygen Delivery Method: Circle system utilized Preoxygenation: Pre-oxygenation with 100% oxygen Intubation Type: IV induction Ventilation: Mask ventilation without difficulty Laryngoscope Size: Mac and 3 Grade View: Grade I Tube type: Oral Tube size: 7.0 mm Number of attempts: 1 Airway Equipment and Method: Stylet Placement Confirmation: ETT inserted through vocal cords under direct vision,  positive ETCO2 and breath sounds checked- equal and bilateral Secured at: 21 cm Tube secured with: Tape Dental Injury: Teeth and Oropharynx as per pre-operative assessment

## 2016-02-12 NOTE — ED Provider Notes (Signed)
[facility]-EMERGENCY DEPT Provider Note   CSN: [id number] Arrival date & time: 02/11/16  2240  First Provider Contact:  1:12 AM   By signing my name below, I, Rosario Adie, attest that this documentation has been prepared under the direction and in the presence of Gilda Crease, MD. Electronically Signed: Rosario Adie, ED Scribe. 02/12/16. 1:16 AM.  History   Chief Complaint Chief Complaint  Patient presents with  . Abdominal Pain   The history is provided by the patient. No language interpreter was used.   HPI Comments: Lindsay [name] is a 45 y.o. female with a PMHx of HTN, who presents to the Emergency Department complaining of gradual onset, constant, aching, 8/10 RUQ abdominal pain onset ~6 days ago, worsening yesterday PM PTA. Pt states that her pain radiates into her right middle back. She notes that initially her pain was intermittent, but over the past few days that it has become constant. Pt has associated nausea, emesis, and diarrhea since the onset of her abdominal pain. No hx of ulcers, pancreatic issues, or reflux. Denies drinking alcohol. Pt has a PSHx of tubal ligation, but no other abdominal surgeries. Denies fever, or any other symptoms.  Past Medical History:  Diagnosis Date  . Hypertension    Patient Active Problem List   Diagnosis Date Noted  . Preventative health care 03/01/2012  . Chronic fatigue 03/01/2012  . OVERWEIGHT 07/29/2009  . HYPOKALEMIA 06/03/2009  . BACK PAIN 06/03/2009  . PLANTAR FASCIITIS, RIGHT 02/12/2009  . Essential hypertension 02/15/2007   Past Surgical History:  Procedure Laterality Date  . TUBAL LIGATION      OB History    No data available     Home Medications    Prior to Admission medications   Medication Sig Start Date End Date Taking? Authorizing Provider  hydrochlorothiazide (MICROZIDE) 12.5 MG capsule Take 1 capsule (12.5 mg total) by mouth daily. 12/01/15  Yes Shirline Frees, NP  lisinopril  (PRINIVIL,ZESTRIL) 40 MG tablet Take 1 tablet (40 mg total) by mouth daily. 12/01/15  Yes Shirline Frees, NP    Family History Family History  Problem Relation Age of Onset  . Hypertension Father   . Lung cancer Mother     Mother was a smoker  . Diabetes Mellitus II Maternal Uncle     Social History Social History  Substance Use Topics  . Smoking status: Never Smoker  . Smokeless tobacco: Not on file  . Alcohol use No    Allergies   Ibuprofen   Review of Systems Review of Systems  Constitutional: Negative for fever.  Gastrointestinal: Positive for abdominal pain, diarrhea, nausea and vomiting.  All other systems reviewed and are negative.  Physical Exam Updated Vital Signs BP (!) 188/114   Pulse 69   Temp 98.4 F (36.9 C) (Oral)   Resp 22   LMP 02/10/2016   SpO2 100%   Physical Exam  Constitutional: She is oriented to person, place, and time. She appears well-developed and well-nourished. No distress.  HENT:  Head: Normocephalic and atraumatic.  Right Ear: Hearing normal.  Left Ear: Hearing normal.  Nose: Nose normal.  Mouth/Throat: Oropharynx is clear and moist and mucous membranes are normal.  Eyes: Conjunctivae and EOM are normal. Pupils are equal, round, and reactive to light.  Neck: Normal range of motion. Neck supple.  Cardiovascular: Regular rhythm, S1 normal and S2 normal.  Exam reveals no gallop and no friction rub.   No murmur heard. Pulmonary/Chest: Effort normal and breath sounds  normal. No respiratory distress. She exhibits tenderness.  TTP at right costal border.  Abdominal: Soft. Normal appearance and bowel sounds are normal. There is no hepatosplenomegaly. There is tenderness. There is no rebound, no guarding, no tenderness at McBurney's point and negative Murphy's sign. No hernia.  Pt exhibits mild, right, upper-lateral tenderness. Negative murphy's sign.   Musculoskeletal: Normal range of motion.  Neurological: She is alert and oriented to  person, place, and time. She has normal strength. No cranial nerve deficit or sensory deficit. Coordination normal. GCS eye subscore is 4. GCS verbal subscore is 5. GCS motor subscore is 6.  Skin: Skin is warm, dry and intact. No rash noted. No cyanosis.  Psychiatric: She has a normal mood and affect. Her speech is normal and behavior is normal. Thought content normal.  Nursing note and vitals reviewed.  ED Treatments / Results  DIAGNOSTIC STUDIES: Oxygen Saturation is 100% on RA, normal by my interpretation.   COORDINATION OF CARE: 1:14 AM-Discussed next steps with pt. Pt verbalized understanding and is agreeable with the plan.   Labs (all labs ordered are listed, but only abnormal results are displayed) Labs Reviewed  LIPASE, BLOOD - Abnormal; Notable for the following:       Result Value   Lipase 57 (*)    All other components within normal limits  COMPREHENSIVE METABOLIC PANEL - Abnormal; Notable for the following:    Glucose, Bld 117 (*)    Creatinine, Ser 1.01 (*)    All other components within normal limits  CBC - Abnormal; Notable for the following:    Hemoglobin 11.9 (*)    All other components within normal limits  URINALYSIS, ROUTINE W REFLEX MICROSCOPIC (NOT AT [facility]) - Abnormal; Notable for the following:    Hgb urine dipstick LARGE (*)    All other components within normal limits  URINE MICROSCOPIC-ADD ON - Abnormal; Notable for the following:    Squamous Epithelial / LPF 0-5 (*)    Bacteria, UA FEW (*)    Casts HYALINE CASTS (*)    All other components within normal limits  POC URINE PREG, ED    EKG  EKG Interpretation None      Radiology Dg Ribs Unilateral W/chest Right  Result Date: 02/12/2016 CLINICAL DATA:  45 year old female with right anterior rib pain EXAM: RIGHT RIBS AND CHEST - 3+ VIEW COMPARISON:  None. FINDINGS: No fracture or other bone lesions are seen involving the ribs. There is no evidence of pneumothorax or pleural effusion. Both lungs  are clear. Heart size and mediastinal contours are within normal limits. IMPRESSION: No rib fracture or pneumothorax. Electronically Signed   By: Elgie Collard M.D.   On: 02/12/2016 01:57  US Abdomen Limited Ruq  Result Date: 02/12/2016 CLINICAL DATA:  Gradual onset of right upper quadrant abdominal pain for the past 6 days. EXAM: US ABDOMEN LIMITED - RIGHT UPPER QUADRANT COMPARISON:  CT abdomen pelvis - 12/15/2009 FINDINGS: Gallbladder: There is a approximately 1.8 cm echogenic shadowing stone lodged within the neck of the gallbladder. There is a minimal amount of gallbladder wall thickening involving the fundus (image 7) with suspected minimal amount of pericholecystic fluid. This sonographer reported the patient experienced pain with sonographic evaluation of the gallbladder (sonographic Murphy's sign). Common bile duct: Diameter: Normal in size measuring 2 mm in diameter Liver: Homogeneous hepatic echotexture. No discrete hepatic lesions. No definite evidence of intrahepatic biliary ductal dilatation. No ascites. IMPRESSION: Cholelithiasis with findings worrisome for early acute cholecystitis. If the clinical  diagnosis remains in question, further evaluation with nuclear medicine HIDA scan could be performed as indicated. Electronically Signed   By: Simonne Come M.D.   On: 02/12/2016 02:08   Procedures Procedures (including critical care time)  Medications Ordered in ED Medications  HYDROmorphone (DILAUDID) injection 1 mg (1 mg Intravenous Given 02/12/16 0133)  ondansetron (ZOFRAN) injection 4 mg (4 mg Intravenous Given 02/12/16 0133)  HYDROmorphone (DILAUDID) injection 1 mg (1 mg Intravenous Given 02/12/16 0256)  labetalol (NORMODYNE,TRANDATE) injection 10 mg (10 mg Intravenous Given 02/12/16 0256)  metoCLOPramide (REGLAN) injection 10 mg (10 mg Intravenous Given 02/12/16 0310)     Initial Impression / Assessment and Plan / ED Course  I have reviewed the triage vital signs and the nursing  notes.  Pertinent labs & imaging results that were available during my care of the patient were reviewed by me and considered in my medical decision making (see chart for details).  Clinical Course    Patient presented to the ER for evaluation of right-sided abdominal pain. Pain has been occurring intermittently for the last several days. Patient has had severe and constant pain this evening. This has been associated with nausea and vomiting, radiation to her back. Pain is entirely on the right side. Examination did reveal right upper quadrant tenderness as well as tenderness over the right side of the ribs. Lab work was unremarkable other than very slightly elevated lipase at 57. She does not have left-sided pain. Ultrasound of the gallbladder has been performed. This does show evidence of early cholecystitis with pericholecystic fluid and gallbladder wall thickening and a 1.8 cm stone. Discussed with Dr. Sheliah Hatch, general surgery. He will see the patient in the ER.  She was hypertensive at arrival. She does have a history of hypertension. She is not experiencing any symptoms with the hypertension. I believe this was secondary to her essential hypertension and pain. She was given labetalol and analgesia in the form of Dilaudid and blood pressure has significantly improved.  Final Clinical Impressions(s) / ED Diagnoses   Final diagnoses:  None  Cholecystitis  New Prescriptions New Prescriptions   No medications on file   I personally performed the services described in this documentation, which was scribed in my presence. The recorded information has been reviewed and is accurate.     Gilda Crease, MD 02/12/16 [phone]

## 2016-02-12 NOTE — Anesthesia Preprocedure Evaluation (Signed)
Anesthesia Evaluation  Patient identified by MRN, date of birth, ID band Patient awake    Reviewed: Allergy & Precautions, NPO status , Patient's Chart, lab work & pertinent test results, reviewed documented beta blocker date and time   Airway Mallampati: II  TM Distance: >3 FB Neck ROM: Full    Dental  (+) Teeth Intact, Dental Advisory Given   Pulmonary    breath sounds clear to auscultation       Cardiovascular hypertension, Pt. on medications  Rhythm:Regular Rate:Normal     Neuro/Psych PSYCHIATRIC DISORDERS Anxiety    GI/Hepatic   Endo/Other    Renal/GU      Musculoskeletal   Abdominal   Peds  Hematology  (+) anemia ,   Anesthesia Other Findings   Reproductive/Obstetrics                             Anesthesia Physical Anesthesia Plan  ASA: II  Anesthesia Plan: General   Post-op Pain Management:    Induction: Intravenous  Airway Management Planned: Oral ETT  Additional Equipment:   Intra-op Plan:   Post-operative Plan: Extubation in OR  Informed Consent: I have reviewed the patients History and Physical, chart, labs and discussed the procedure including the risks, benefits and alternatives for the proposed anesthesia with the patient or authorized representative who has indicated his/her understanding and acceptance.   Dental advisory given  Plan Discussed with:   Anesthesia Plan Comments:         Anesthesia Quick Evaluation

## 2016-02-12 NOTE — ED Notes (Signed)
Patient transported to X-ray 

## 2016-02-12 NOTE — ED Notes (Signed)
Attempted to call report

## 2016-02-13 MED ORDER — OXYCODONE HCL 5 MG PO TABS: 5.0000 mg | ORAL_TABLET | ORAL | 0 refills | Status: DC | PRN

## 2016-02-13 NOTE — Discharge Instructions (Signed)
[facility] LAPAROSCOPIC SURGERY: POST OP INSTRUCTIONS Always review your discharge instruction sheet given to you by the facility where your surgery was performed. IF YOU HAVE DISABILITY OR FAMILY LEAVE FORMS, YOU MUST BRING THEM TO THE OFFICE FOR PROCESSING.   DO NOT GIVE THEM TO YOUR DOCTOR.  1. A prescription for pain medication may be given to you upon discharge.  Take your pain medication as prescribed, if needed.  If narcotic pain medicine is not needed, then you may take acetaminophen (Tylenol) or ibuprofen (Advil) as needed. 2. Take your usually prescribed medications unless otherwise directed. 3. If you need a refill on your pain medication, please contact your pharmacy.  They will contact our office to request authorization. Prescriptions will not be filled after 5pm or on week-ends. 4. You should follow a light diet the first few days after arrival home, such as soup and crackers, etc.  Be sure to include lots of fluids daily. 5. Most patients will experience some swelling and bruising in the area of the incisions.  Ice packs will help.  Swelling and bruising can take several days to resolve.  6. It is common to experience some constipation if taking pain medication after surgery.  Increasing fluid intake and taking a stool softener (such as Colace) will usually help or prevent this problem from occurring.  A mild laxative (Milk of Magnesia or Miralax) should be taken according to package instructions if there are no bowel movements after 48 hours. 7. Unless discharge instructions indicate otherwise, you may remove your bandages 48 hours after surgery, and you may shower at that time.  You  have steri-strips (small skin tapes) in place directly over the incision.  These strips should be left on the skin for 7-10 days.  8. ACTIVITIES:  You may resume regular (light) daily activities beginning the next day--such as daily self-care, walking, climbing stairs--gradually  increasing activities as tolerated.  You may have sexual intercourse when it is comfortable.  Refrain from any heavy lifting or straining until approved by your doctor. a. You may drive when you are no longer taking prescription pain medication, you can comfortably wear a seatbelt, and you can safely maneuver your car and apply brakes. 9. You should see your doctor in the office for a follow-up appointment approximately 2-3 weeks after your surgery.  Make sure that you call for this appointment within a day or two after you arrive home to insure a convenient appointment time. 10. OTHER INSTRUCTIONS:DO NOT LIFT, PUSH, OR PULL ANYTHING GREATER THAN 15 LBS FOR 2 WEEKS  WHEN TO CALL YOUR DOCTOR: 1. Fever over 101.0 2. Inability to urinate 3. Continued bleeding from incision. 4. Increased pain, redness, or drainage from the incision. 5. Increasing abdominal pain  The clinic staff is available to answer your questions during regular business hours.  Please dont hesitate to call and ask to speak to one of the nurses for clinical concerns.  If you have a medical emergency, go to the nearest emergency room or call 911.  A surgeon from [facility] is always on call at the hospital. [address], [address], [place], [place]  [address] ? [address] [address], [place], [place]   [address] [phone] ? [phone] ? FAX [phone] Web site: [email or web address]

## 2016-02-13 NOTE — Discharge Summary (Signed)
Physician Discharge Summary  Lindsay [name] DUK:[id number] DOB: [birth year] DOA: 02/12/2016  PCP: Shirline Frees, NP  Admit date: 02/12/2016 Discharge date: 02/13/2016  Recommendations for Outpatient Follow-up:   Follow-up Information    [facility] Schedule an appointment as soon as possible for a visit in 2 week(s).   Specialty:  General Surgery Why:  to see DOW clinic Contact information: [address] [place] [address] [phone]         Discharge Diagnoses:    Acute calculous cholecystitis Hypertension   Surgical Procedure: laparoscopic cholecystectomy with ioc  Discharge Condition: good Disposition: home  Diet recommendation: low fat  Filed Weights   02/12/16 0644  Weight: 73.2 kg (161 lb 4.8 oz)    History of present illness:  45 yo female with 4 days of right upper and epigastric abdominal pain. She does not note any strange foods and states she had a burger and fries before it came on. The pain worsened in intensity over the 4 days and is now unbearable. She has had no previous episodes before this week. She has had nausea and vomiting the last few days, no diarrhea or constipation.  History of lap appy and ectopic pregancy surgery  Hospital Course:  Pt was admitted and started on IV abx and later taken to OR by Dr Corliss Skains for laparoscopic cholecystectomy with ioc. Her postop course was unremarkable. On POD 1 she was ambulating, tolerating a diet, her vitals were stable. We discussed dc instructions.   BP 107/66 (BP Location: Left Arm)   Pulse 71   Temp 99.2 F (37.3 C) (Oral)   Resp 18   Ht 5' (1.524 m)   Wt 73.2 kg (161 lb 4.8 oz)   LMP 02/10/2016   SpO2 100%   BMI 31.50 kg/m   Gen: asleep, easily awakens, NAD, non-toxic appearing Pupils: equal, no scleral icterus Pulm: Lungs clear to auscultation, symmetric chest rise CV: regular rate and rhythm Abd: soft, mild approp tender,  nondistended.  No cellulitis. No incisional hernia Ext: no edema, no calf tenderness Skin: no rash, no jaundice   Discharge Instructions  Discharge Instructions    Call MD for:    Complete by:  As directed   Temperature >101   Call MD for:  hives    Complete by:  As directed   Call MD for:  persistant dizziness or light-headedness    Complete by:  As directed   Call MD for:  persistant nausea and vomiting    Complete by:  As directed   Call MD for:  redness, tenderness, or signs of infection (pain, swelling, redness, odor or green/yellow discharge around incision site)    Complete by:  As directed   Call MD for:  severe uncontrolled pain    Complete by:  As directed   Diet general    Complete by:  As directed   Discharge instructions    Complete by:  As directed   See [facility] discharge instructions   Increase activity slowly    Complete by:  As directed       Medication List    TAKE these medications   hydrochlorothiazide 12.5 MG capsule Commonly known as:  MICROZIDE Take 1 capsule (12.5 mg total) by mouth daily.   lisinopril 40 MG tablet Commonly known as:  PRINIVIL,ZESTRIL Take 1 tablet (40 mg total) by mouth daily.   oxyCODONE 5 MG immediate release tablet Commonly known as:  Oxy IR/ROXICODONE Take 1-2  tablets (5-10 mg total) by mouth every 4 (four) hours as needed for moderate pain.      Follow-up Information    [facility] [place]. Schedule an appointment as soon as possible for a visit in 2 week(s).   Specialty:  General Surgery Why:  to see DOW clinic Contact information: [address] [place] [address] [phone]           The results of significant diagnostics from this hospitalization (including imaging, microbiology, ancillary and laboratory) are listed below for reference.    Significant Diagnostic Studies: Dg Ribs Unilateral W/chest Right  Result Date: 02/12/2016 CLINICAL DATA:  45 year old female with  right anterior rib pain EXAM: RIGHT RIBS AND CHEST - 3+ VIEW COMPARISON:  None. FINDINGS: No fracture or other bone lesions are seen involving the ribs. There is no evidence of pneumothorax or pleural effusion. Both lungs are clear. Heart size and mediastinal contours are within normal limits. IMPRESSION: No rib fracture or pneumothorax. Electronically Signed   By: Elgie Collard M.D.   On: 02/12/2016 01:57  Dg Cholangiogram Operative  Result Date: 02/12/2016 CLINICAL DATA:  Calculus cholecystitis EXAM: INTRAOPERATIVE CHOLANGIOGRAM TECHNIQUE: Cholangiographic images from the C-arm fluoroscopic device were submitted for interpretation post-operatively. Please see the procedural report for the amount of contrast and the fluoroscopy time utilized. COMPARISON:  02/12/2016 ultrasound FINDINGS: Intraoperative cholangiogram performed during the laparoscopic procedure. The cystic duct, common hepatic duct, and common bile duct are patent. Contrast drains into the duodenum. Accessory right hepatic duct noted off the common hepatic duct. Contrast refluxes into the proximal pancreatic duct which is mildly distended. IMPRESSION: Patent biliary system. Electronically Signed   By: Judie Petit.  Shick M.D.   On: 02/12/2016 11:22  US Abdomen Limited Ruq  Result Date: 02/12/2016 CLINICAL DATA:  Gradual onset of right upper quadrant abdominal pain for the past 6 days. EXAM: US ABDOMEN LIMITED - RIGHT UPPER QUADRANT COMPARISON:  CT abdomen pelvis - 12/15/2009 FINDINGS: Gallbladder: There is a approximately 1.8 cm echogenic shadowing stone lodged within the neck of the gallbladder. There is a minimal amount of gallbladder wall thickening involving the fundus (image 7) with suspected minimal amount of pericholecystic fluid. This sonographer reported the patient experienced pain with sonographic evaluation of the gallbladder (sonographic Murphy's sign). Common bile duct: Diameter: Normal in size measuring 2 mm in diameter Liver:  Homogeneous hepatic echotexture. No discrete hepatic lesions. No definite evidence of intrahepatic biliary ductal dilatation. No ascites. IMPRESSION: Cholelithiasis with findings worrisome for early acute cholecystitis. If the clinical diagnosis remains in question, further evaluation with nuclear medicine HIDA scan could be performed as indicated. Electronically Signed   By: Simonne Come M.D.   On: 02/12/2016 02:08   Microbiology: Recent Results (from the past 240 hour(s))  Surgical pcr screen     Status: None   Collection Time: 02/12/16  8:12 AM  Result Value Ref Range Status   MRSA, PCR NEGATIVE NEGATIVE Final   Staphylococcus aureus NEGATIVE NEGATIVE Final    Comment:        The Xpert SA Assay (FDA approved for NASAL specimens in patients over 46 years of age), is one component of a comprehensive surveillance program.  Test performance has been validated by [facility] for patients greater than or equal to 19 year old. It is not intended to diagnose infection nor to guide or monitor treatment.      Labs: Basic Metabolic Panel:  Recent Labs Lab 02/11/16 2308  NA 137  K 3.6  CL 103  CO2 29  GLUCOSE 117*  BUN 13  CREATININE 1.01*  CALCIUM 9.0   Liver Function Tests:  Recent Labs Lab 02/11/16 2308  AST 18  ALT 17  ALKPHOS 61  BILITOT 0.3  PROT 7.3  ALBUMIN 3.6    Recent Labs Lab 02/11/16 2308  LIPASE 57*   No results for input(s): AMMONIA in the last 168 hours. CBC:  Recent Labs Lab 02/11/16 2308  WBC 5.8  HGB 11.9*  HCT 36.2  MCV 84.6  PLT 263   Cardiac Enzymes: No results for input(s): CKTOTAL, CKMB, CKMBINDEX, TROPONINI in the last 168 hours. BNP: BNP (last 3 results) No results for input(s): BNP in the last 8760 hours.  ProBNP (last 3 results) No results for input(s): PROBNP in the last 8760 hours.  CBG: No results for input(s): GLUCAP in the last 168 hours.  Active Problems:   Acute calculous cholecystitis   Time coordinating  discharge: 15 minutes  Signed:  Atilano Ina, MD [facility] [place] [phone] 02/13/2016, 9:26 AM

## 2016-02-15 ENCOUNTER — Encounter (HOSPITAL_COMMUNITY): Payer: Self-pay | Admitting: Surgery

## 2016-04-01 ENCOUNTER — Telehealth: Payer: Self-pay | Admitting: Adult Health

## 2016-04-01 NOTE — Telephone Encounter (Signed)
Please Advise Home Advice given see attached note.  Home Advise okay or is an appointment needed?      PLEASE NOTE: All timestamps contained within this report are represented as [place]. CONFIDENTIALTY NOTICE: This fax transmission is intended only for the addressee. It contains information that is legally privileged, confidential or otherwise protected from use or disclosure. If you are not the intended recipient, you are strictly prohibited from reviewing, disclosing, copying using or disseminating any of this information or taking any action in reliance on or regarding this information. If you have received this fax in error, please notify us immediately by telephone so that we can arrange for its return to us. Phone: [phone], Toll-Free: [phone], Fax: [phone] Page: 1 of 2 Call Id: [id number] [facility] - Client TELEPHONE ADVICE RECORD [facility] Patient Name: Lindsay [name] Gender: Female DOB: June 30, 1971 Age: [age] Y 4 M Return Phone Number: [phone] (Primary) Address: City/State/Zip: [place] Client [facility] - Client Client Site  [facility] Day Physician Shirline FreesNafziger, Cory - NP Contact Type Call Who Is Calling Patient / Member / Family / Caregiver Call Type Triage / Clinical Relationship To Patient Self Return Phone Number [phone] (Primary) Chief Complaint Ringworm Reason for Call Symptomatic / Request for Health Information Initial Comment caller states she has ringworm Appointment Disposition EMR Appointment Not Necessary Info pasted into Epic Yes PreDisposition Did not know what to do Translation No Nurse Assessment Nurse: Debera Latalston, RN, Tinnie GensJeffrey Date/Time (Eastern Time): 04/01/2016 9:51:31 AM Confirm and document reason for call. If symptomatic, describe symptoms. You must click the next button to save text entered. ---Caller states she has ringworm.  Noticed symptoms yesterday. No fever. No vaginal discharge. Had ringworm on previously wrist. Has the patient traveled out of the country within the last 30 days? ---No Does the patient have any new or worsening symptoms? ---Yes Will a triage be completed? ---Yes Related visit to physician within the last 2 weeks? ---No Does the PT have any chronic conditions? (i.e. diabetes, asthma, etc.) ---Yes List chronic conditions. ---HTN Is the patient pregnant or possibly pregnant? (Ask all females between the ages of 4912-55) ---No Is this a behavioral health or substance abuse call? ---No Guidelines Guideline Title Affirmed Question Affirmed Notes Nurse Date/Time [name](Eastern Time) Ringworm Ringworm (all triage questions negative) Debera LatRalston, RN, Tinnie GensJeffrey 04/01/2016 9:53:16 AM Disp. Time [name](Eastern Time) Disposition Final User 04/01/2016 9:57:33 AM Home Care Yes Debera Latalston, RN, Tinnie GensJeffrey PLEASE NOTE: All timestamps contained within this report are represented as [place]. CONFIDENTIALTY NOTICE: This fax transmission is intended only for the addressee. It contains information that is legally privileged, confidential or otherwise protected from use or disclosure. If you are not the intended recipient, you are strictly prohibited from reviewing, disclosing, copying using or disseminating any of this information or taking any action in reliance on or regarding this information. If you have received this fax in error, please notify us immediately by telephone so that we can arrange for its return to us. Phone: [phone], Toll-Free: [phone], Fax: [phone] Page: 2 of 2 Call Id: [id number] Caller Understands: Yes Disagree/Comply: Comply Care Advice Given Per Guideline HOME CARE: You should be able to treat this at home. REASSURANCE: It sounds like ringworm and we can easily treat that at home. Ringworm is a fungal infection of the skin. It is called ringworm because of the ring-like shape of  the skin rash (there is no worm). ANTIFUNGAL CREAM: *  Apply antifungal cream 2 times per day to ringworm on the body. Apply it to the rash and 1 inch beyond its borders. * Continue the cream for at least 7 days after the rash is cleared. * Available OTC in [place] as clotrimazole (Lotrimin AF) or miconazole (Micatin, Monistat-Derm). CONTAGIOUSNESS: * Ringworm is mildly contagious. It requires direct skin-to-skin contact. * The type acquired from pets is not transmitted human-to-human, only animal to human. CALL BACK IF: * Rash continues to spread after 1 week on treatment * Rash is not cleared by 4 weeks. CARE ADVICE given per Ringworm (Adult) guideline. EXPECTED COURSE: It clears completely in 3 to 4 weeks. For any recurrences, suspect the household puppy or kitten and take it to the vet for diagnosis and treatment. Comments User: Ed Blalock, RN Date/Time [name] Time): 04/01/2016 9:58:42 AM Caller indicates that ringworm is now in her vaginal area.

## 2016-04-01 NOTE — Telephone Encounter (Signed)
Called and left a Voicemail for pt to return call to office.

## 2016-04-01 NOTE — Telephone Encounter (Signed)
[facility] - Client TELEPHONE ADVICE RECORD [facility] Patient Name: Lindsay [name] DOB: 20-Jul-1970 Initial Comment caller states she has ringworm Nurse Assessment Nurse: Debera Latalston, RN, Tinnie GensJeffrey Date/Time [name](Eastern Time): 04/01/2016 9:51:31 AM Confirm and document reason for call. If symptomatic, describe symptoms. You must click the next button to save text entered. ---Caller states she has ringworm. Noticed symptoms yesterday. No fever. No vaginal discharge. Had ringworm on previously wrist. Has the patient traveled out of the country within the last 30 days? ---No Does the patient have any new or worsening symptoms? ---Yes Will a triage be completed? ---Yes Related visit to physician within the last 2 weeks? ---No Does the PT have any chronic conditions? (i.e. diabetes, asthma, etc.) ---Yes List chronic conditions. ---HTN Is the patient pregnant or possibly pregnant? (Ask all females between the ages of 5912-55) ---No Is this a behavioral health or substance abuse call? ---No Guidelines Guideline Title Affirmed Question Affirmed Notes Ringworm Ringworm (all triage questions negative) Final Disposition User Home Care [place], RN, Tinnie GensJeffrey Comments Caller indicates that ringworm is now in her vaginal area. Disagree/Comply: Comply

## 2016-04-01 NOTE — Telephone Encounter (Signed)
I think it would be reasonable to at least offer her an appointment due to location to make sure nothing else is going on.

## 2016-04-04 NOTE — Telephone Encounter (Signed)
Please Advise pt has not called back.

## 2016-04-05 NOTE — Telephone Encounter (Signed)
Third attempt to contact patient - unable to reach.  Is there anything you recommend per [facility]?

## 2016-04-05 NOTE — Telephone Encounter (Signed)
Lotrimin is fine. Would be happy to see her if she can be reached

## 2016-04-06 NOTE — Telephone Encounter (Signed)
Left message for patient to return phone call.  

## 2016-04-07 NOTE — Telephone Encounter (Signed)
Attempted to contact patient - still unable to reach. Left message for patient explaining that we have tried to contact multiple times with no success, we just wanted to make sure patient was okay, and if she needed to schedule an appt. Left message explaining this information.

## 2016-08-02 ENCOUNTER — Other Ambulatory Visit: Payer: Self-pay | Admitting: Adult Health

## 2016-08-02 DIAGNOSIS — I10 Essential (primary) hypertension: Secondary | ICD-10-CM

## 2016-08-28 ENCOUNTER — Other Ambulatory Visit: Payer: Self-pay | Admitting: Adult Health

## 2016-08-28 DIAGNOSIS — I10 Essential (primary) hypertension: Secondary | ICD-10-CM

## 2016-08-29 ENCOUNTER — Other Ambulatory Visit: Payer: Self-pay | Admitting: Adult Health

## 2016-08-29 DIAGNOSIS — I10 Essential (primary) hypertension: Secondary | ICD-10-CM

## 2016-08-29 NOTE — Telephone Encounter (Signed)
Ok to refill for 6 months 

## 2016-08-30 NOTE — Telephone Encounter (Signed)
Ok to refill for 6 months 

## 2016-08-31 ENCOUNTER — Other Ambulatory Visit (INDEPENDENT_AMBULATORY_CARE_PROVIDER_SITE_OTHER): Payer: Commercial Managed Care - HMO

## 2016-08-31 DIAGNOSIS — Z0000 Encounter for general adult medical examination without abnormal findings: Secondary | ICD-10-CM | POA: Diagnosis not present

## 2016-08-31 LAB — HEPATIC FUNCTION PANEL
ALK PHOS: 49 U/L (ref 39–117)
ALT: 13 U/L (ref 0–35)
AST: 13 U/L (ref 0–37)
Albumin: 3.7 g/dL (ref 3.5–5.2)
BILIRUBIN DIRECT: 0 mg/dL (ref 0.0–0.3)
BILIRUBIN TOTAL: 0.2 mg/dL (ref 0.2–1.2)
Total Protein: 6.4 g/dL (ref 6.0–8.3)

## 2016-08-31 LAB — CBC WITH DIFFERENTIAL/PLATELET
Basophils Absolute: 0 10*3/uL (ref 0.0–0.1)
Basophils Relative: 0.5 % (ref 0.0–3.0)
EOS PCT: 3.3 % (ref 0.0–5.0)
Eosinophils Absolute: 0.1 10*3/uL (ref 0.0–0.7)
HCT: 36.4 % (ref 36.0–46.0)
Hemoglobin: 11.9 g/dL — ABNORMAL LOW (ref 12.0–15.0)
LYMPHS ABS: 1.4 10*3/uL (ref 0.7–4.0)
Lymphocytes Relative: 38.9 % (ref 12.0–46.0)
MCHC: 32.6 g/dL (ref 30.0–36.0)
MCV: 88.7 fl (ref 78.0–100.0)
MONO ABS: 0.3 10*3/uL (ref 0.1–1.0)
MONOS PCT: 8.9 % (ref 3.0–12.0)
NEUTROS ABS: 1.8 10*3/uL (ref 1.4–7.7)
NEUTROS PCT: 48.4 % (ref 43.0–77.0)
PLATELETS: 246 10*3/uL (ref 150.0–400.0)
RBC: 4.11 Mil/uL (ref 3.87–5.11)
RDW: 14.5 % (ref 11.5–15.5)
WBC: 3.7 10*3/uL — ABNORMAL LOW (ref 4.0–10.5)

## 2016-08-31 LAB — BASIC METABOLIC PANEL
BUN: 13 mg/dL (ref 6–23)
CO2: 28 meq/L (ref 19–32)
Calcium: 8.7 mg/dL (ref 8.4–10.5)
Chloride: 106 mEq/L (ref 96–112)
Creatinine, Ser: 0.95 mg/dL (ref 0.40–1.20)
GFR: 81.9 mL/min (ref >60.00)
GLUCOSE: 86 mg/dL (ref 70–99)
Potassium: 4 mEq/L (ref 3.5–5.1)
Sodium: 137 mEq/L (ref 135–145)

## 2016-08-31 LAB — LIPID PANEL
CHOLESTEROL: 149 mg/dL (ref 0–200)
HDL: 54.4 mg/dL (ref >39.00)
LDL Cholesterol: 84 mg/dL (ref 0–99)
NONHDL: 94.25
Total CHOL/HDL Ratio: 3
Triglycerides: 51 mg/dL (ref 0.0–149.0)
VLDL: 10.2 mg/dL (ref 0.0–40.0)

## 2016-08-31 LAB — TSH: TSH: 0.57 u[IU]/mL (ref 0.35–4.50)

## 2016-09-02 ENCOUNTER — Other Ambulatory Visit: Payer: Commercial Managed Care - HMO

## 2016-09-02 ENCOUNTER — Ambulatory Visit (INDEPENDENT_AMBULATORY_CARE_PROVIDER_SITE_OTHER): Payer: Commercial Managed Care - HMO | Admitting: Adult Health

## 2016-09-02 ENCOUNTER — Encounter: Payer: Self-pay | Admitting: Adult Health

## 2016-09-02 VITALS — BP 128/76 | Temp 98.3°F | Ht 60.0 in | Wt 160.4 lb

## 2016-09-02 DIAGNOSIS — Z0000 Encounter for general adult medical examination without abnormal findings: Secondary | ICD-10-CM

## 2016-09-02 DIAGNOSIS — I10 Essential (primary) hypertension: Secondary | ICD-10-CM

## 2016-09-02 DIAGNOSIS — R5383 Other fatigue: Secondary | ICD-10-CM

## 2016-09-02 DIAGNOSIS — Z23 Encounter for immunization: Secondary | ICD-10-CM

## 2016-09-02 LAB — IBC PANEL
Iron: 32 ug/dL — ABNORMAL LOW (ref 42–145)
Saturation Ratios: 7.3 % — ABNORMAL LOW (ref 20.0–50.0)
Transferrin: 313 mg/dL (ref 212.0–360.0)

## 2016-09-02 LAB — POC URINALSYSI DIPSTICK (AUTOMATED)
Bilirubin, UA: NEGATIVE
Glucose, UA: NEGATIVE
KETONES UA: NEGATIVE
Leukocytes, UA: NEGATIVE
Nitrite, UA: NEGATIVE
PH UA: 7
PROTEIN UA: NEGATIVE
RBC UA: NEGATIVE
SPEC GRAV UA: 1.02
UROBILINOGEN UA: 0.2

## 2016-09-02 LAB — VITAMIN D 25 HYDROXY (VIT D DEFICIENCY, FRACTURES): VITD: 13.38 ng/mL — AB (ref 30.00–100.00)

## 2016-09-02 NOTE — Patient Instructions (Signed)
It was great seeing you today!   Your exam and labs are great!   Start working out and exercising. Continue with healthy diet   Please follow up with me if the fatigue does not resolve in the next few weeks.

## 2016-09-02 NOTE — Progress Notes (Signed)
Subjective:    Patient ID: Lindsay [name], female    DOB: March 02, 1971, 46 y.o.   MRN: [id number]  HPI  Patient presents for yearly preventative medicine examination. She is a pleasant 46 year old female who  has a past medical history of Hypertension and Iron deficiency anemia.  All immunizations and health maintenance protocols were reviewed with the patient and needed orders were placed.  Appropriate screening laboratory values were ordered for the patient including screening of hyperlipidemia, renal function and hepatic function. If indicated by BPH, a PSA was ordered.   Medication reconciliation,  past medical history, social history, problem list and allergies were reviewed in detail with the patient  Goals were established with regard to weight loss, exercise, and  diet in compliance with medications. She has been eating a heart healthy diet but is not exercising. She is also doing portion control. She does not exercise   She takes lisinopril 20 mg and HCTZ 12.5 mg for essential hypertension. She reports that her blood pressure has been up and down but mostly stays in the 120's.   Her biggest complaint is that of fatigue for the last week. She feels as though she could sleep all the time. Denies feeling ill or having fever.   She has had her mammogram this year, is up to date on pap and dental and vision exams.    Review of Systems  Constitutional: Positive for fatigue.  HENT: Negative.   Eyes: Negative.   Respiratory: Negative.   Cardiovascular: Negative.   Gastrointestinal: Negative.   Endocrine: Negative.   Genitourinary: Negative.   Musculoskeletal: Negative.   Skin: Negative.   Allergic/Immunologic: Negative.   Neurological: Negative.   Hematological: Negative.   Psychiatric/Behavioral: Negative.   All other systems reviewed and are negative.  Past Medical History:  Diagnosis Date  . Hypertension   . Iron deficiency anemia     Social History   Social  History  . Marital status: Married    Spouse name: N/A  . Number of children: N/A  . Years of education: N/A   Occupational History  . Not on file.   Social History Main Topics  . Smoking status: Never Smoker  . Smokeless tobacco: Never Used  . Alcohol use No  . Drug use: No  . Sexual activity: Yes    Birth control/ protection: None   Other Topics Concern  . 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 APPENDECTOMY     "when I was a child"  . CHOLECYSTECTOMY N/A 02/12/2016   Procedure: LAPAROSCOPIC CHOLECYSTECTOMY WITH INTRAOPERATIVE CHOLANGIOGRAM;  Surgeon: Manus Rudd, MD;  Location: [facility] OR;  Service: General;  Laterality: N/A;  . ECTOPIC PREGNANCY SURGERY  1998  . LAPAROSCOPIC CHOLECYSTECTOMY  02/12/2016   w/IOC  . TUBAL LIGATION  2009    Family History  Problem Relation Age of Onset  . Hypertension Father   . Lung cancer Mother     Mother was a smoker  . Diabetes Mellitus II Maternal Uncle     Allergies  Allergen Reactions  . Ibuprofen Hypertension    Current Outpatient Prescriptions on File Prior to Visit  Medication Sig Dispense Refill  . hydrochlorothiazide (MICROZIDE) 12.5 MG capsule TAKE 1 CAPSULE(12.5 MG) BY MOUTH DAILY 90 capsule 3  . lisinopril (PRINIVIL,ZESTRIL) 20 MG tablet TAKE 1 TABLET(20 MG) BY MOUTH DAILY 90 tablet 1  .  oxyCODONE (OXY IR/ROXICODONE) 5 MG immediate release tablet Take 1-2 tablets (5-10 mg total) by mouth every 4 (four) hours as needed for moderate pain. 30 tablet 0   No current facility-administered medications on file prior to visit.     BP 128/76   Temp 98.3 F (36.8 C) (Oral)   Ht 5' (1.524 m)   Wt 160 lb 6.4 oz (72.8 kg)   BMI 31.33 kg/m       Objective:   Physical Exam  Constitutional: She is oriented to person, place, and time. She appears well-developed and well-nourished. No distress.  HENT:  Head: Normocephalic and atraumatic.    Right Ear: External ear normal.  Left Ear: External ear normal.  Nose: Nose normal.  Mouth/Throat: Oropharynx is clear and moist. No oropharyngeal exudate.  Eyes: Conjunctivae and EOM are normal. Pupils are equal, round, and reactive to light. Right eye exhibits no discharge. Left eye exhibits no discharge. No scleral icterus.  Neck: Normal range of motion. Neck supple. No JVD present. No tracheal deviation present. No thyromegaly present.  Cardiovascular: Normal rate, regular rhythm, normal heart sounds and intact distal pulses.  Exam reveals no gallop and no friction rub.   No murmur heard. Pulmonary/Chest: Effort normal and breath sounds normal. No stridor. No respiratory distress. She has no wheezes. She has no rales. She exhibits no tenderness.  Abdominal: Soft. Bowel sounds are normal. She exhibits no distension and no mass. There is no tenderness. There is no rebound and no guarding.  Genitourinary:  Genitourinary Comments: Breast Exam: No masses, lumps, dimpling, or discharge   Musculoskeletal: Normal range of motion. She exhibits no edema, tenderness or deformity.  Lymphadenopathy:    She has no cervical adenopathy.  Neurological: She is alert and oriented to person, place, and time. She has normal reflexes. She displays normal reflexes. No cranial nerve deficit. She exhibits normal muscle tone. Coordination normal.  Skin: Skin is warm and dry. No rash noted. She is not diaphoretic. No erythema. No pallor.  Psychiatric: She has a normal mood and affect. Her behavior is normal. Judgment and thought content normal.  Nursing note and vitals reviewed.      Assessment & Plan:  1. Routine general medical examination at a health care facility - Reviewed labs with patient in detail. All questions answered - POCT Urinalysis Dipstick (Automated) - Continue with healthy diet and start exercising   2. Other fatigue - Possibly due to viral illness, sedentary lifestyle, vitamin D deficiency   - Advised to start exercing - Vitamin D, 25-hydroxy - Stay hydrated  - Follow up if no improvement in the next 2 weeks   3. Need for influenza vaccination - Flu Vaccine QUAD 36+ mos PF IM (Fluarix & Fluzone Quad PF)  4. Essential hypertension - At goal today  - Continue on current medication   Lindsay Freesory Lovetta Condie, NP

## 2016-09-16 ENCOUNTER — Telehealth: Payer: Self-pay | Admitting: Adult Health

## 2016-09-16 ENCOUNTER — Other Ambulatory Visit: Payer: Self-pay | Admitting: Adult Health

## 2016-09-16 DIAGNOSIS — I10 Essential (primary) hypertension: Secondary | ICD-10-CM

## 2016-09-16 NOTE — Telephone Encounter (Signed)
Pt stating that she should be getting 40 MG instead of 20 MG of lisinopril.  Pharm:  [facility] [address]

## 2016-09-16 NOTE — Telephone Encounter (Signed)
Ok to refill for one year  

## 2016-09-16 NOTE — Telephone Encounter (Signed)
Would you mind reviewing this? Per last office visit note, patient was taking Lisinopril 20mg . Please advise. Thanks!

## 2017-02-28 DIAGNOSIS — H5034 Intermittent alternating exotropia: Secondary | ICD-10-CM | POA: Diagnosis not present

## 2017-02-28 DIAGNOSIS — H538 Other visual disturbances: Secondary | ICD-10-CM | POA: Diagnosis not present

## 2017-02-28 DIAGNOSIS — H53031 Strabismic amblyopia, right eye: Secondary | ICD-10-CM | POA: Diagnosis not present

## 2017-03-22 ENCOUNTER — Other Ambulatory Visit: Payer: Self-pay | Admitting: Adult Health

## 2017-03-22 ENCOUNTER — Telehealth: Payer: Self-pay | Admitting: Adult Health

## 2017-03-22 DIAGNOSIS — Z01419 Encounter for gynecological examination (general) (routine) without abnormal findings: Secondary | ICD-10-CM | POA: Diagnosis not present

## 2017-03-22 MED ORDER — FLUTICASONE PROPIONATE 50 MCG/ACT NA SUSP: 2.0000 | Freq: Every day | NASAL | 6 refills | Status: AC

## 2017-03-22 NOTE — Telephone Encounter (Signed)
Pt is calling needing Rx Flonase called in to Pharm:  [facility] [address]

## 2017-03-22 NOTE — Telephone Encounter (Signed)
flonase sent in

## 2017-03-22 NOTE — Telephone Encounter (Signed)
Lindsay [name], you have not prescribed Flonase in the past.  Please advise.

## 2017-08-10 ENCOUNTER — Other Ambulatory Visit: Payer: Self-pay | Admitting: Adult Health

## 2017-08-10 DIAGNOSIS — I10 Essential (primary) hypertension: Secondary | ICD-10-CM

## 2017-08-11 NOTE — Telephone Encounter (Signed)
Left a message for a return call.

## 2017-08-16 NOTE — Telephone Encounter (Signed)
Pt returned call and states that she was unaware of a mail order rx request. She has always used [facility] for her medications. Advised her to contact [facility] and/or her insurance company to see if they are now requiring her to use mail order.  In addition, this rx was last sent 08/02/2016 #90 with 0 refills.

## 2017-08-17 ENCOUNTER — Other Ambulatory Visit: Payer: Self-pay

## 2017-08-17 ENCOUNTER — Telehealth: Payer: Self-pay | Admitting: Adult Health

## 2017-08-17 NOTE — Telephone Encounter (Signed)
Copied from CRM [id number]. Topic: Quick Communication - See Telephone Encounter >> Aug 17, 2017  5:18 PM Everardo PacificMoton, [place], [place] wrote: CRM for notification. Patient calling because she is out her Hydrochlorothiazide 12.5mg  medication. She was told by Zenon MayoSheena to call the pharmacy to make sure that the medication is not on a recall, pt has done that and has made an appointment to be seen. Stated that she has been out of her meds for 8 days now and is starting to have headaches.She would like to speak to someone about getting her meds refilled asap. She can be reached at [phone]

## 2017-08-17 NOTE — Telephone Encounter (Signed)
Left voicemail for patient to return phone call so that an appointment can be made for a refill.

## 2017-08-17 NOTE — Telephone Encounter (Signed)
Left voicemail for patient to schedule an appointment. Prescription denied patient needs to schedule an appointment to be seen.

## 2017-08-18 ENCOUNTER — Other Ambulatory Visit: Payer: Self-pay | Admitting: Adult Health

## 2017-08-18 DIAGNOSIS — I10 Essential (primary) hypertension: Secondary | ICD-10-CM

## 2017-08-18 MED ORDER — HYDROCHLOROTHIAZIDE 12.5 MG PO CAPS: ORAL_CAPSULE | ORAL | 0 refills | Status: DC

## 2017-08-18 NOTE — Telephone Encounter (Signed)
Pt has appt 08/22/17 for CPE. She would like to have meds refilled.   Cory - Please advise. Thanks!

## 2017-08-18 NOTE — Telephone Encounter (Signed)
Refill sent to pharmacy.   

## 2017-08-22 ENCOUNTER — Encounter: Payer: Commercial Managed Care - HMO | Admitting: Adult Health

## 2017-09-08 ENCOUNTER — Encounter: Payer: Self-pay | Admitting: Adult Health

## 2017-09-08 ENCOUNTER — Ambulatory Visit (INDEPENDENT_AMBULATORY_CARE_PROVIDER_SITE_OTHER): Payer: 59 | Admitting: Adult Health

## 2017-09-08 VITALS — BP 118/80 | Temp 98.5°F | Ht 62.0 in | Wt 160.0 lb

## 2017-09-08 DIAGNOSIS — I10 Essential (primary) hypertension: Secondary | ICD-10-CM | POA: Diagnosis not present

## 2017-09-08 DIAGNOSIS — E611 Iron deficiency: Secondary | ICD-10-CM

## 2017-09-08 DIAGNOSIS — E559 Vitamin D deficiency, unspecified: Secondary | ICD-10-CM | POA: Diagnosis not present

## 2017-09-08 DIAGNOSIS — T464X5A Adverse effect of angiotensin-converting-enzyme inhibitors, initial encounter: Secondary | ICD-10-CM

## 2017-09-08 DIAGNOSIS — Z0000 Encounter for general adult medical examination without abnormal findings: Secondary | ICD-10-CM | POA: Diagnosis not present

## 2017-09-08 DIAGNOSIS — R05 Cough: Secondary | ICD-10-CM | POA: Diagnosis not present

## 2017-09-08 DIAGNOSIS — Z23 Encounter for immunization: Secondary | ICD-10-CM | POA: Diagnosis not present

## 2017-09-08 LAB — BASIC METABOLIC PANEL
BUN: 13 mg/dL (ref 6–23)
CHLORIDE: 103 meq/L (ref 96–112)
CO2: 27 meq/L (ref 19–32)
Calcium: 9.1 mg/dL (ref 8.4–10.5)
Creatinine, Ser: 0.86 mg/dL (ref 0.40–1.20)
GFR: 91.45 mL/min (ref >60.00)
GLUCOSE: 80 mg/dL (ref 70–99)
Potassium: 4 mEq/L (ref 3.5–5.1)
SODIUM: 138 meq/L (ref 135–145)

## 2017-09-08 LAB — CBC WITH DIFFERENTIAL/PLATELET
BASOS PCT: 0.4 % (ref 0.0–3.0)
Basophils Absolute: 0 10*3/uL (ref 0.0–0.1)
Eosinophils Absolute: 0.1 10*3/uL (ref 0.0–0.7)
Eosinophils Relative: 3.5 % (ref 0.0–5.0)
HCT: 38.6 % (ref 36.0–46.0)
Hemoglobin: 12.5 g/dL (ref 12.0–15.0)
LYMPHS ABS: 1.3 10*3/uL (ref 0.7–4.0)
Lymphocytes Relative: 35.6 % (ref 12.0–46.0)
MCHC: 32.4 g/dL (ref 30.0–36.0)
MCV: 87.1 fl (ref 78.0–100.0)
MONO ABS: 0.4 10*3/uL (ref 0.1–1.0)
MONOS PCT: 11.9 % (ref 3.0–12.0)
NEUTROS PCT: 48.6 % (ref 43.0–77.0)
Neutro Abs: 1.8 10*3/uL (ref 1.4–7.7)
Platelets: 271 10*3/uL (ref 150.0–400.0)
RBC: 4.43 Mil/uL (ref 3.87–5.11)
RDW: 15.4 % (ref 11.5–15.5)
WBC: 3.8 10*3/uL — ABNORMAL LOW (ref 4.0–10.5)

## 2017-09-08 LAB — LIPID PANEL
Cholesterol: 151 mg/dL (ref 0–200)
HDL: 58.3 mg/dL (ref >39.00)
LDL Cholesterol: 82 mg/dL (ref 0–99)
NONHDL: 92.81
Total CHOL/HDL Ratio: 3
Triglycerides: 54 mg/dL (ref 0.0–149.0)
VLDL: 10.8 mg/dL (ref 0.0–40.0)

## 2017-09-08 LAB — HEPATIC FUNCTION PANEL
ALBUMIN: 3.8 g/dL (ref 3.5–5.2)
ALK PHOS: 46 U/L (ref 39–117)
ALT: 13 U/L (ref 0–35)
AST: 14 U/L (ref 0–37)
Bilirubin, Direct: 0.1 mg/dL (ref 0.0–0.3)
Total Bilirubin: 0.4 mg/dL (ref 0.2–1.2)
Total Protein: 6.8 g/dL (ref 6.0–8.3)

## 2017-09-08 LAB — IRON: IRON: 65 ug/dL (ref 42–145)

## 2017-09-08 LAB — TSH: TSH: 1.28 u[IU]/mL (ref 0.35–4.50)

## 2017-09-08 LAB — VITAMIN D 25 HYDROXY (VIT D DEFICIENCY, FRACTURES): VITD: 23.89 ng/mL — ABNORMAL LOW (ref 30.00–100.00)

## 2017-09-08 MED ORDER — LOSARTAN POTASSIUM 25 MG PO TABS: 25.0000 mg | ORAL_TABLET | Freq: Every day | ORAL | 1 refills | Status: DC

## 2017-09-08 NOTE — Patient Instructions (Signed)
It was great seeing you today   I have switched your blood pressure medication from lisinopril to Cozzar. This should take care of your cough   I will follow up with you regarding your blood work   Please start exercising

## 2017-09-08 NOTE — Progress Notes (Signed)
Subjective:    Patient ID: Lindsay [name], female    DOB: 05/25/1971, 47 y.o.   MRN: [id number]  HPI Patient presents for yearly preventative medicine examination. She is a pleasant 47 year old female who  has a past medical history of Hypertension and Iron deficiency anemia.  All immunizations and health maintenance protocols were reviewed with the patient and needed orders were placed.  Appropriate screening laboratory values were ordered for the patient including screening of hyperlipidemia, renal function and hepatic function.  Medication reconciliation,  past medical history, social history, problem list and allergies were reviewed in detail with the patient  Goals were established with regard to weight loss, exercise, and  diet in compliance with medications. She eats healthy and continues to do portion control. She is not exercising.   Wt Readings from Last 3 Encounters:  09/08/17 160 lb (72.6 kg)  09/02/16 160 lb 6.4 oz (72.8 kg)  02/12/16 161 lb 4.8 oz (73.2 kg)    End of life planning was discussed.  She is up to date on her pap.   She continues to complain of a dry cough which has been present for some times now. She has been using Flonase which does not help  Review of Systems  Constitutional: Negative.   HENT: Negative.   Eyes: Negative.   Respiratory: Positive for cough. Negative for shortness of breath and wheezing.   Cardiovascular: Negative.   Gastrointestinal: Negative.   Endocrine: Negative.   Genitourinary: Negative.   Musculoskeletal: Negative.   Skin: Negative.   Allergic/Immunologic: Negative.   Neurological: Negative.   Hematological: Negative.   Psychiatric/Behavioral: Negative.    Past Medical History:  Diagnosis Date  . Hypertension   . Iron deficiency anemia     Social History   Socioeconomic History  . Marital status: Married    Spouse name: Not on file  . Number of children: Not on file  . Years of education: Not on file  .  Highest education level: Not on file  Social Needs  . Financial resource strain: Not on file  . Food insecurity - worry: Not on file  . Food insecurity - inability: Not on file  . Transportation needs - medical: Not on file  . Transportation needs - non-medical: Not on file  Occupational History  . Not on file  Tobacco Use  . Smoking status: Never Smoker  . Smokeless tobacco: Never Used  Substance and Sexual Activity  . Alcohol use: No    Alcohol/week: 0.0 oz  . Drug use: No  . Sexual activity: Yes    Birth control/protection: None  Other Topics Concern  . 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 APPENDECTOMY     "when I was a child"  . CHOLECYSTECTOMY N/A 02/12/2016   Procedure: LAPAROSCOPIC CHOLECYSTECTOMY WITH INTRAOPERATIVE CHOLANGIOGRAM;  Surgeon: Manus RuddMatthew Tsuei, MD;  Location: [facility] OR;  Service: General;  Laterality: N/A;  . ECTOPIC PREGNANCY SURGERY  1998  . LAPAROSCOPIC CHOLECYSTECTOMY  02/12/2016   w/IOC  . TUBAL LIGATION  2009    Family History  Problem Relation Age of Onset  . Hypertension Father   . Lung cancer Mother        Mother was a smoker  . Diabetes Mellitus II Maternal Uncle     Allergies  Allergen Reactions  . Ibuprofen Hypertension    Current Outpatient Medications on File Prior  to Visit  Medication Sig Dispense Refill  . fluticasone (FLONASE) 50 MCG/ACT nasal spray Place 2 sprays into both nostrils daily. 16 g 6  . hydrochlorothiazide (MICROZIDE) 12.5 MG capsule TAKE 1 CAPSULE(12.5 MG) BY MOUTH DAILY 90 capsule 0  . lisinopril (PRINIVIL,ZESTRIL) 40 MG tablet TAKE 1 TABLET(40 MG) BY MOUTH DAILY 90 tablet 3   No current facility-administered medications on file prior to visit.     BP 118/80 (BP Location: Left Arm)   Temp 98.5 F (36.9 C) (Oral)   Ht 5\' 2"  (1.575 m)   Wt 160 lb (72.6 kg)   LMP 08/14/2017 (Approximate)   BMI 29.26 kg/m       Objective:    Physical Exam  Constitutional: She is oriented to person, place, and time. She appears well-developed and well-nourished. No distress.  HENT:  Head: Normocephalic and atraumatic.  Right Ear: External ear normal.  Left Ear: External ear normal.  Nose: Nose normal.  Mouth/Throat: Oropharynx is clear and moist. No oropharyngeal exudate.  Eyes: Conjunctivae and EOM are normal. Pupils are equal, round, and reactive to light. Right eye exhibits no discharge. Left eye exhibits no discharge. No scleral icterus.  Neck: Normal range of motion. Neck supple. No JVD present. No tracheal deviation present. No thyromegaly present.  Cardiovascular: Normal rate, regular rhythm, normal heart sounds and intact distal pulses. Exam reveals no gallop and no friction rub.  No murmur heard. Pulmonary/Chest: Effort normal and breath sounds normal. No stridor. No respiratory distress. She has no wheezes. She has no rales. She exhibits no tenderness.  Abdominal: Soft. Bowel sounds are normal. She exhibits no distension and no mass. There is no tenderness. There is no rebound and no guarding.  Musculoskeletal: Normal range of motion. She exhibits no edema, tenderness or deformity.  Lymphadenopathy:    She has no cervical adenopathy.  Neurological: She is alert and oriented to person, place, and time. She has normal reflexes. She displays normal reflexes. No cranial nerve deficit. She exhibits normal muscle tone. Coordination normal.  Skin: Skin is warm and dry. No rash noted. She is not diaphoretic. No erythema. No pallor.  Psychiatric: She has a normal mood and affect. Her behavior is normal. Judgment and thought content normal.  Nursing note and vitals reviewed.     Assessment & Plan:  1. Routine general medical examination at a health care facility - Follow up in one year or sooner if needed - Basic metabolic panel - CBC with Differential/Platelet - Hepatic function panel - TSH - Lipid panel  2. Essential  hypertension - D/c Lisinopril and switch to Cozzar for cough  - Basic metabolic panel - CBC with Differential/Platelet - Hepatic function panel - TSH - Lipid panel - losartan (COZAAR) 25 MG tablet; Take 1 tablet (25 mg total) by mouth daily.  Dispense: 90 tablet; Refill: 1  3. Cough due to ACE inhibitor - Will d/c lisinopril  - losartan (COZAAR) 25 MG tablet; Take 1 tablet (25 mg total) by mouth daily.  Dispense: 90 tablet; Refill: 1  4. Need for influenza vaccination  - Flu Vaccine QUAD 6+ mos PF IM (Fluarix Quad PF)  Shirline Frees, NP

## 2017-09-12 ENCOUNTER — Telehealth: Payer: Self-pay | Admitting: Adult Health

## 2017-09-12 NOTE — Telephone Encounter (Signed)
Copied from CRM [id number]. Topic: Quick Communication - Lab Results >> Sep 12, 2017  4:04 PM Guinevere FerrariMorris, Emerita Berkemeier E, NT wrote: Patient called back to get lab results. Triage may disclose results.

## 2017-09-13 NOTE — Telephone Encounter (Signed)
Attempted to contact pt regarding return call for lab results; left message at [phone].

## 2017-09-18 ENCOUNTER — Ambulatory Visit: Payer: Self-pay

## 2017-09-18 NOTE — Telephone Encounter (Signed)
Pt. States 1 week after getting her flu vaccine she started having body aches. Denies any fever or other symptoms. States she actually is feeling better today. Will call back if she starts feeling worse.  Reason for Disposition . [1] MILD pain (e.g., does not interfere with normal activities) AND [2] present < 7 days  Answer Assessment - Initial Assessment Questions 1. ONSET: "When did the muscle aches or body pains start?"      Started this past Thursday 2. LOCATION: "What part of your body is hurting?" (e.g., entire body, arms, legs)      All over 3. SEVERITY: "How bad is the pain?" (Scale 1-10; or mild, moderate, severe)   - MILD (1-3): doesn't interfere with normal activities    - MODERATE (4-7): interferes with normal activities or awakens from sleep    - SEVERE (8-10):  excruciating pain, unable to do any normal activities      3 4. CAUSE: "What do you think is causing the pains?"     Unsure 5. FEVER: "Have you been having fever?"     No 6. OTHER SYMPTOMS: "Do you have any other symptoms?" (e.g., chest pain, weakness, rash, cold or flu symptoms, weight loss)     Night sweats 7. PREGNANCY: "Is there any chance you are pregnant?" "When was your last menstrual period?"     No 8. TRAVEL: "Have you traveled out of the country in the last month?" (e.g., travel history, exposures)     No  Protocols used: MUSCLE ACHES AND BODY PAIN-A-AH

## 2017-09-20 ENCOUNTER — Encounter: Payer: Self-pay | Admitting: Family Medicine

## 2017-09-20 ENCOUNTER — Ambulatory Visit: Payer: 59 | Admitting: Family Medicine

## 2017-09-20 DIAGNOSIS — M25551 Pain in right hip: Secondary | ICD-10-CM | POA: Insufficient documentation

## 2017-09-20 MED ORDER — METHYLPREDNISOLONE ACETATE 40 MG/ML IJ SUSP
40.0000 mg | Freq: Once | INTRAMUSCULAR | Status: AC
  Administered 2017-09-20: 40 mg via INTRA_ARTICULAR

## 2017-09-20 NOTE — Progress Notes (Signed)
PCP: Shirline Frees, NP  Subjective:   HPI: Patient is a 47 y.o. female here for right hip pain.  Patient reports about 1 year ago she tripped over a tree trunk and fell directly onto her right hip. Pain ever since that time lateral right hip. No radiation of pain. Tried topical medications, tylenol, aleve with mild benefit. Worse with prolonged standing and when tried to jog. Has been limping. Pain level 5/10 and sharp. Worse lying on this hip. No skin changes, numbness.  Past Medical History:  Diagnosis Date  . Hypertension   . Iron deficiency anemia     Current Outpatient Medications on File Prior to Visit  Medication Sig Dispense Refill  . fluticasone (FLONASE) 50 MCG/ACT nasal spray Place 2 sprays into both nostrils daily. 16 g 6  . hydrochlorothiazide (MICROZIDE) 12.5 MG capsule TAKE 1 CAPSULE(12.5 MG) BY MOUTH DAILY 90 capsule 0  . losartan (COZAAR) 25 MG tablet Take 1 tablet (25 mg total) by mouth daily. 90 tablet 1   No current facility-administered medications on file prior to visit.     Past Surgical History:  Procedure Laterality Date  . APPENDECTOMY     "when I was a child"  . CHOLECYSTECTOMY N/A 02/12/2016   Procedure: LAPAROSCOPIC CHOLECYSTECTOMY WITH INTRAOPERATIVE CHOLANGIOGRAM;  Surgeon: Manus Rudd, MD;  Location: [facility] OR;  Service: General;  Laterality: N/A;  . ECTOPIC PREGNANCY SURGERY  1998  . LAPAROSCOPIC CHOLECYSTECTOMY  02/12/2016   w/IOC  . TUBAL LIGATION  2009    Allergies  Allergen Reactions  . Ibuprofen Hypertension    Social History   Socioeconomic History  . Marital status: Married    Spouse name: Not on file  . Number of children: Not on file  . Years of education: Not on file  . Highest education level: Not on file  Social Needs  . Financial resource strain: Not on file  . Food insecurity - worry: Not on file  . Food insecurity - inability: Not on file  . Transportation needs - medical: Not on file  . Transportation needs -  non-medical: Not on file  Occupational History  . Not on file  Tobacco Use  . Smoking status: Never Smoker  . Smokeless tobacco: Never Used  Substance and Sexual Activity  . Alcohol use: No    Alcohol/week: 0.0 oz  . Drug use: No  . Sexual activity: Yes    Birth control/protection: None  Other Topics Concern  . 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 Hypertension Father   . Lung cancer Mother        Mother was a smoker  . Diabetes Mellitus II Maternal Uncle     BP 134/81   Pulse 68   Ht 5' (1.524 m)   Wt 159 lb (72.1 kg)   BMI 31.05 kg/m   Review of Systems: See HPI above.     Objective:  Physical Exam:  Gen: NAD, comfortable in exam room  Right hip: No deformity, swelling, bruising. TTP trochanteric bursa, proximal IT band.  No other tenderness. FROM with pain on hip abduction and external rotation. Strength 4/5 with hip abduction, 5/5 other motions. NVI distally. Negative logroll, piriformis, fabers.  Left hip: No deformity. FROM with 5/5 strength. No tenderness to palpation. NVI distally.   Assessment & Plan:  1. Right hip pain - 2/2 trochanteric bursitis and IT band syndrome.  Icing, shown home exercises and stretches to do daily.  Injection given today as well.  Tylenol and/or aleve if needed.  F/u in 6 weeks.  Consider physical therapy.  After informed written consent timeout was performed.  Patient was lying on left side on exam table.  Area overlying right greater trochanteric bursa prepped with alcohol swab then right greater trochanteric bursa injected with 6:2 bupivicaine: depomedrol.  Patient tolerated procedure well without immediate complications.

## 2017-09-20 NOTE — Patient Instructions (Signed)
You have IT band syndrome and trochanteric bursitis. Avoid painful activities as much as possible. Ice over area of pain 3-4 times a day for 15 minutes at a time Standing hip rotations and hip side raise exercise 3 sets of 10 once a day - add weights if this becomes too easy. Stretches - pick 2-3 and hold for 20-30 seconds x 3 - do once or twice a day. Tylenol and/or aleve as needed for pain. You were given a cortisone injection today. If not improving, can consider physical therapy. Follow up with me in 6 weeks.

## 2017-09-20 NOTE — Assessment & Plan Note (Signed)
2/2 trochanteric bursitis and IT band syndrome.  Icing, shown home exercises and stretches to do daily.  Injection given today as well.  Tylenol and/or aleve if needed.  F/u in 6 weeks.  Consider physical therapy.  After informed written consent timeout was performed.  Patient was lying on left side on exam table.  Area overlying right greater trochanteric bursa prepped with alcohol swab then right greater trochanteric bursa injected with 6:2 bupivicaine: depomedrol.  Patient tolerated procedure well without immediate complications.

## 2017-09-21 ENCOUNTER — Telehealth: Payer: Self-pay | Admitting: Family Medicine

## 2017-09-21 NOTE — Telephone Encounter (Signed)
error 

## 2017-11-02 ENCOUNTER — Ambulatory Visit: Payer: 59 | Admitting: Family Medicine

## 2017-11-02 DIAGNOSIS — M25551 Pain in right hip: Secondary | ICD-10-CM | POA: Diagnosis not present

## 2017-11-02 NOTE — Patient Instructions (Signed)
You have IT band syndrome and trochanteric bursitis. Avoid painful activities as much as possible. Ice over area of pain 3-4 times a day for 15 minutes at a time Standing hip rotations and hip side raise exercise 3 sets of 10 once a day - add weights if this becomes too easy. Stretches - pick 2-3 and hold for 20-30 seconds x 3 - do once or twice a day. Tylenol and/or aleve as needed for pain. If not improving, can consider physical therapy. Follow up with me as needed.

## 2017-11-05 ENCOUNTER — Encounter: Payer: Self-pay | Admitting: Family Medicine

## 2017-11-05 NOTE — Progress Notes (Signed)
PCP: Shirline Frees, NP  Subjective:   HPI: Patient is a 47 y.o. female here for right hip pain.   3/6: Patient reports about 1 year ago she tripped over a tree trunk and fell directly onto her right hip. Pain ever since that time lateral right hip. No radiation of pain. Tried topical medications, tylenol, aleve with mild benefit. Worse with prolonged standing and when tried to jog. Has been limping. Pain level 5/10 and sharp. Worse lying on this hip. No skin changes, numbness.  4/18: Patient reports only a little improvement following trochanteric bursa injection. Worse if stands a long time. Pain up to 6/10 level, sharp. Only doing exercises sometimes. Pain is posterior and lateral right hip. No skin changes, numbness, radiation.  Past Medical History:  Diagnosis Date  . Hypertension   . Iron deficiency anemia     Current Outpatient Medications on File Prior to Visit  Medication Sig Dispense Refill  . fluticasone (FLONASE) 50 MCG/ACT nasal spray Place 2 sprays into both nostrils daily. 16 g 6  . hydrochlorothiazide (MICROZIDE) 12.5 MG capsule TAKE 1 CAPSULE(12.5 MG) BY MOUTH DAILY 90 capsule 0  . losartan (COZAAR) 25 MG tablet Take 1 tablet (25 mg total) by mouth daily. 90 tablet 1   No current facility-administered medications on file prior to visit.     Past Surgical History:  Procedure Laterality Date  . APPENDECTOMY     "when I was a child"  . CHOLECYSTECTOMY N/A 02/12/2016   Procedure: LAPAROSCOPIC CHOLECYSTECTOMY WITH INTRAOPERATIVE CHOLANGIOGRAM;  Surgeon: Manus Rudd, MD;  Location: [facility] OR;  Service: General;  Laterality: N/A;  . ECTOPIC PREGNANCY SURGERY  1998  . LAPAROSCOPIC CHOLECYSTECTOMY  02/12/2016   w/IOC  . TUBAL LIGATION  2009    Allergies  Allergen Reactions  . Ibuprofen Hypertension    Social History   Socioeconomic History  . Marital status: Married    Spouse name: Not on file  . Number of children: Not on file  . Years of  education: Not on file  . Highest education level: Not on file  Occupational History  . Not on file  Social Needs  . Financial resource strain: Not on file  . Food insecurity:    Worry: Not on file    Inability: Not on file  . Transportation needs:    Medical: Not on file    Non-medical: Not on file  Tobacco Use  . Smoking status: Never Smoker  . Smokeless tobacco: Never Used  Substance and Sexual Activity  . Alcohol use: No    Alcohol/week: 0.0 oz  . Drug use: No  . Sexual activity: Yes    Birth control/protection: None  Lifestyle  . Physical activity:    Days per week: Not on file    Minutes per session: Not on file  . Stress: Not on file  Relationships  . Social connections:    Talks on phone: Not on file    Gets together: Not on file    Attends religious service: Not on file    Active member of club or organization: Not on file    Attends meetings of clubs or organizations: Not on file    Relationship status: Not on file  . Intimate partner violence:    Fear of current or ex partner: Not on file    Emotionally abused: Not on file    Physically abused: Not on file    Forced sexual activity: Not on file  Other Topics Concern  .  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 Hypertension Father   . Lung cancer Mother        Mother was a smoker  . Diabetes Mellitus II Maternal Uncle     BP (!) 147/100   Ht 5' (1.524 m)   Wt 160 lb (72.6 kg)   BMI 31.25 kg/m   Review of Systems: See HPI above.     Objective:  Physical Exam:  Gen: NAD, comfortable in exam room  Right hip: No deformity, swelling, bruising. TTP proximal IT band, less at bursa than previously. FROM with pain on hip abduction with 4/5 strength.  5/5 other motions. NVI distally. Negative logroll, piriformis, faber.   Assessment & Plan:  1. Right hip pain - 2/2 trochanteric bursitis and IT band syndrome.  Strongly  encouraged to do her home exercises and stretches daily.  Icing, tylenol, aleve if needed.  She would like to wait on physical therapy for now.  F/u prn otherwise.

## 2017-11-05 NOTE — Assessment & Plan Note (Signed)
2/2 trochanteric bursitis and IT band syndrome.  Strongly encouraged to do her home exercises and stretches daily.  Icing, tylenol, aleve if needed.  She would like to wait on physical therapy for now.  F/u prn otherwise.

## 2017-11-19 ENCOUNTER — Other Ambulatory Visit: Payer: Self-pay | Admitting: Adult Health

## 2017-11-19 DIAGNOSIS — I10 Essential (primary) hypertension: Secondary | ICD-10-CM

## 2017-11-21 NOTE — Telephone Encounter (Signed)
Sent to the pharmacy by e-scribe. 

## 2018-03-05 ENCOUNTER — Ambulatory Visit: Payer: Self-pay | Admitting: Family Medicine

## 2018-03-18 ENCOUNTER — Other Ambulatory Visit: Payer: Self-pay | Admitting: Adult Health

## 2018-03-18 DIAGNOSIS — I10 Essential (primary) hypertension: Secondary | ICD-10-CM

## 2018-03-18 DIAGNOSIS — R05 Cough: Secondary | ICD-10-CM

## 2018-03-18 DIAGNOSIS — T464X5A Adverse effect of angiotensin-converting-enzyme inhibitors, initial encounter: Secondary | ICD-10-CM

## 2018-03-23 DIAGNOSIS — Z6830 Body mass index (BMI) 30.0-30.9, adult: Secondary | ICD-10-CM | POA: Diagnosis not present

## 2018-03-23 DIAGNOSIS — Z01419 Encounter for gynecological examination (general) (routine) without abnormal findings: Secondary | ICD-10-CM | POA: Diagnosis not present

## 2018-06-16 ENCOUNTER — Other Ambulatory Visit: Payer: Self-pay | Admitting: Adult Health

## 2018-06-16 DIAGNOSIS — T464X5A Adverse effect of angiotensin-converting-enzyme inhibitors, initial encounter: Secondary | ICD-10-CM

## 2018-06-16 DIAGNOSIS — I10 Essential (primary) hypertension: Secondary | ICD-10-CM

## 2018-06-16 DIAGNOSIS — R05 Cough: Secondary | ICD-10-CM

## 2018-06-19 NOTE — Telephone Encounter (Signed)
Sent to the pharmacy by e-scribe. 

## 2018-08-15 ENCOUNTER — Other Ambulatory Visit: Payer: Self-pay | Admitting: Adult Health

## 2018-08-15 DIAGNOSIS — I10 Essential (primary) hypertension: Secondary | ICD-10-CM

## 2018-08-16 NOTE — Telephone Encounter (Signed)
Left a message for a return call.  Pt due for cpx and fasting lab work on/after 09/09/2018.  CRM created.

## 2018-08-22 NOTE — Telephone Encounter (Signed)
Sent to 30 days.

## 2018-09-14 ENCOUNTER — Other Ambulatory Visit: Payer: Self-pay | Admitting: Adult Health

## 2018-09-14 DIAGNOSIS — R05 Cough: Secondary | ICD-10-CM

## 2018-09-14 DIAGNOSIS — T464X5A Adverse effect of angiotensin-converting-enzyme inhibitors, initial encounter: Secondary | ICD-10-CM

## 2018-09-14 DIAGNOSIS — I10 Essential (primary) hypertension: Secondary | ICD-10-CM

## 2018-09-14 NOTE — Telephone Encounter (Signed)
Due for cpx. 

## 2018-09-19 ENCOUNTER — Encounter: Payer: Self-pay | Admitting: Family Medicine

## 2018-10-14 ENCOUNTER — Other Ambulatory Visit: Payer: Self-pay | Admitting: Adult Health

## 2018-10-14 DIAGNOSIS — R05 Cough: Secondary | ICD-10-CM

## 2018-10-14 DIAGNOSIS — I10 Essential (primary) hypertension: Secondary | ICD-10-CM

## 2018-10-14 DIAGNOSIS — T464X5A Adverse effect of angiotensin-converting-enzyme inhibitors, initial encounter: Secondary | ICD-10-CM

## 2018-10-15 NOTE — Telephone Encounter (Signed)
Left a message for a return call.

## 2018-10-26 ENCOUNTER — Telehealth: Payer: Self-pay | Admitting: Adult Health

## 2018-10-26 NOTE — Telephone Encounter (Signed)
Copied from CRM [id number]. Topic: Quick Communication - Rx Refill/Question >> Oct 26, 2018  3:41 PM Jens Som A wrote: Medication: losartan (COZAAR) 25 MG tablet [[id number]] , hydrochlorothiazide (MICROZIDE) 12.5 MG capsule [[id number]]   Has the patient contacted their pharmacy? Yes  (Agent: If no, request that the patient contact the pharmacy for the refill.) (Agent: If yes, when and what did the pharmacy advise?)  Preferred Pharmacy (with phone number or street name):   Agent: Please be advised that RX refills may take up to 3 business days. We ask that you follow-up with your pharmacy.

## 2018-10-29 ENCOUNTER — Encounter: Payer: Self-pay | Admitting: Adult Health

## 2018-10-29 MED ORDER — LOSARTAN POTASSIUM 25 MG PO TABS: ORAL_TABLET | ORAL | 0 refills | Status: DC

## 2018-10-29 MED ORDER — HYDROCHLOROTHIAZIDE 12.5 MG PO CAPS: 12.5000 mg | ORAL_CAPSULE | Freq: Every day | ORAL | 0 refills | Status: DC

## 2018-10-29 NOTE — Addendum Note (Signed)
Addended by: Raj Janus T on: 10/29/2018 11:25 AM   Modules accepted: Orders

## 2018-10-29 NOTE — Telephone Encounter (Signed)
Pt now scheduled to see [facility] for virtual visit.  Advised the pt to have her BP cuff handy.

## 2018-10-30 ENCOUNTER — Other Ambulatory Visit: Payer: Self-pay

## 2018-10-30 ENCOUNTER — Encounter: Payer: Self-pay | Admitting: Adult Health

## 2018-10-30 ENCOUNTER — Ambulatory Visit (INDEPENDENT_AMBULATORY_CARE_PROVIDER_SITE_OTHER): Payer: 59 | Admitting: Adult Health

## 2018-10-30 VITALS — BP 141/82 | Wt 152.0 lb

## 2018-10-30 DIAGNOSIS — I10 Essential (primary) hypertension: Secondary | ICD-10-CM

## 2018-10-30 NOTE — Progress Notes (Signed)
Virtual Visit via Video Note  I connected with Lindsay [name]  on 10/30/18 at 10:30 AM EDT by a video enabled telemedicine application and verified that I am speaking with the correct person using two identifiers.  Location patient: home Location provider:work or home office Persons participating in the virtual visit: patient, provider  I discussed the limitations of evaluation and management by telemedicine and the availability of in person appointments. The patient expressed understanding and agreed to proceed.   HPI: 48 year old female who is being awaited today for follow-up regarding essential hypertension.  Is currently prescribed Cozaar 25 mg daily and hydrochlorothiazide 12.5 mg daily.  Pressure medication was called in yesterday and she had been out for the previous 3 days.  During the time that she was out she endorses having headaches. She has restarted her medications and is no longer having a headaches. Her BP this mening was 141/82. She has not taken her medication yet this morning.   She is happy to report that she has been eating less meat and has been able to lose weight. Her current weight at home is 152 lbs.   Wt Readings from Last 3 Encounters:  10/30/18 152 lb (68.9 kg)  11/02/17 160 lb (72.6 kg)  09/20/17 159 lb (72.1 kg)      BP Readings from Last 10 Encounters:  10/30/18 (!) 141/82  11/02/17 (!) 147/100  09/20/17 134/81  09/08/17 118/80  09/02/16 128/76  02/13/16 110/69  12/15/15 132/90  12/01/15 (!) 162/108  10/07/15 (!) 146/96  08/04/15 140/90   ROS: See pertinent positives and negatives per HPI.  Past Medical History:  Diagnosis Date  . Hypertension   . Iron deficiency anemia     Past Surgical History:  Procedure Laterality Date  . APPENDECTOMY     "when I was a child"  . CHOLECYSTECTOMY N/A 02/12/2016   Procedure: LAPAROSCOPIC CHOLECYSTECTOMY WITH INTRAOPERATIVE CHOLANGIOGRAM;  Surgeon: Manus RuddMatthew Tsuei, MD;  Location: [facility] OR;  Service: General;   Laterality: N/A;  . ECTOPIC PREGNANCY SURGERY  1998  . LAPAROSCOPIC CHOLECYSTECTOMY  02/12/2016   w/IOC  . TUBAL LIGATION  2009    Family History  Problem Relation Age of Onset  . Hypertension Father   . Lung cancer Mother        Mother was a smoker  . Diabetes Mellitus II Maternal Uncle       Current Outpatient Medications:  .  fluticasone (FLONASE) 50 MCG/ACT nasal spray, Place 2 sprays into both nostrils daily., Disp: 16 g, Rfl: 6 .  hydrochlorothiazide (MICROZIDE) 12.5 MG capsule, Take 1 capsule (12.5 mg total) by mouth daily., Disp: 30 capsule, Rfl: 0 .  losartan (COZAAR) 25 MG tablet, TAKE 1 TABLET(25 MG) BY MOUTH DAILY, Disp: 30 tablet, Rfl: 0  EXAM:  VITALS per patient if applicable:  GENERAL: alert, oriented, appears well and in no acute distress  HEENT: atraumatic, conjunttiva clear, no obvious abnormalities on inspection of external nose and ears  NECK: normal movements of the head and neck  LUNGS: on inspection no signs of respiratory distress, breathing rate appears normal, no obvious gross SOB, gasping or wheezing  CV: no obvious cyanosis  MS: moves all visible extremities without noticeable abnormality  PSYCH/NEURO: pleasant and cooperative, no obvious depression or anxiety, speech and thought processing grossly intact  ASSESSMENT AND PLAN:  Discussed the following assessment and plan:  Essential hypertension  - No change in medications at this time  - Advised to take BP medication at the same time  every day. Continue to monitor BP over the next week and inform me via mychart if BP constantly above 140 systolic    I discussed the assessment and treatment plan with the patient. The patient was provided an opportunity to ask questions and all were answered. The patient agreed with the plan and demonstrated an understanding of the instructions.   The patient was advised to call back or seek an in-person evaluation if the symptoms worsen or if the  condition fails to improve as anticipated.   Shirline Frees, NP

## 2018-11-23 ENCOUNTER — Other Ambulatory Visit: Payer: Self-pay | Admitting: Adult Health

## 2018-11-23 DIAGNOSIS — I10 Essential (primary) hypertension: Secondary | ICD-10-CM

## 2018-11-23 DIAGNOSIS — R05 Cough: Secondary | ICD-10-CM

## 2018-11-23 DIAGNOSIS — R058 Other specified cough: Secondary | ICD-10-CM

## 2018-11-23 NOTE — Telephone Encounter (Signed)
Spoke to the pt.  She stopped taking her BP on 11/10/2018 as previous readings were normal.  She started feeling bad a few days ago.  Her BP yesterday was 143/97.  Yesterday was 130/82.  Not feeling well today.  Please advise.

## 2018-11-23 NOTE — Telephone Encounter (Signed)
Her readings were normal because the blood pressure medication was keeping it that way. Needs to restart meds. Ok to send in 90 days + 1 of each

## 2018-11-23 NOTE — Telephone Encounter (Signed)
Spoke to the pt and advised that Lindsay [name] would like for her to check BP readings 3-4 x a day from now until I reach her on Monday.  She has enough medication to get her through the weekend.  She checked BP while on the phone and was 141/88.

## 2018-11-26 NOTE — Telephone Encounter (Signed)
LMOM for a return call.  

## 2018-11-27 MED ORDER — LOSARTAN POTASSIUM 50 MG PO TABS: 50.0000 mg | ORAL_TABLET | Freq: Every day | ORAL | 0 refills | Status: DC

## 2018-11-27 NOTE — Telephone Encounter (Signed)
Okay to refill hydrochlorothiazide 12.5 mg x 90 days.  Increase Cozaar from 25 mg to 50 mg x 30 days .  Send BP results in 2 weeks

## 2018-11-27 NOTE — Telephone Encounter (Signed)
BP readings  147/92 (taken a few minutes ago), 135/98,151/96, 154/93, 135/89, 130/85, 149/86, 119/83, 141/87, 143/97 130/82  Pt is now out of medications.  Needs rx today.  Please advise.

## 2018-11-27 NOTE — Telephone Encounter (Signed)
Pt notified to send in BP readings in 2 weeks.  Medication sent.  Nothing further needed at this time.

## 2019-01-20 ENCOUNTER — Other Ambulatory Visit: Payer: Self-pay | Admitting: Adult Health

## 2019-01-23 ENCOUNTER — Encounter: Payer: Self-pay | Admitting: Family Medicine

## 2019-01-23 NOTE — Telephone Encounter (Signed)
Sent to the pharmacy by e-scribe.  Will release message in MyChart for the pt to call the office to schedule cpx.

## 2019-01-23 NOTE — Telephone Encounter (Signed)
Ok to refill for 90 days. Needs to get scheduled for CPE

## 2019-02-15 ENCOUNTER — Other Ambulatory Visit: Payer: Self-pay

## 2019-02-15 DIAGNOSIS — Z20822 Contact with and (suspected) exposure to covid-19: Secondary | ICD-10-CM

## 2019-02-17 LAB — NOVEL CORONAVIRUS, NAA: SARS-CoV-2, NAA: NOT DETECTED

## 2019-02-21 ENCOUNTER — Other Ambulatory Visit: Payer: Self-pay | Admitting: Adult Health

## 2019-02-21 DIAGNOSIS — I10 Essential (primary) hypertension: Secondary | ICD-10-CM

## 2019-02-22 NOTE — Telephone Encounter (Signed)
SENT TO THE PHARMACY BY E-SCRIBE FOR 30 DAYS.  PT IS PAST DUE FOR CPX AND FASTING LAB WORK.

## 2019-03-23 ENCOUNTER — Other Ambulatory Visit: Payer: Self-pay | Admitting: Adult Health

## 2019-03-23 DIAGNOSIS — I10 Essential (primary) hypertension: Secondary | ICD-10-CM

## 2019-03-26 NOTE — Telephone Encounter (Signed)
Denied.  Needs CPX

## 2019-04-06 ENCOUNTER — Other Ambulatory Visit: Payer: Self-pay | Admitting: Adult Health

## 2019-04-06 DIAGNOSIS — I10 Essential (primary) hypertension: Secondary | ICD-10-CM

## 2019-04-08 ENCOUNTER — Other Ambulatory Visit: Payer: Self-pay | Admitting: Adult Health

## 2019-04-08 DIAGNOSIS — I10 Essential (primary) hypertension: Secondary | ICD-10-CM

## 2019-04-09 ENCOUNTER — Other Ambulatory Visit: Payer: Self-pay | Admitting: Adult Health

## 2019-04-09 DIAGNOSIS — I10 Essential (primary) hypertension: Secondary | ICD-10-CM

## 2019-04-09 NOTE — Telephone Encounter (Signed)
Message routed to PCP CMA  

## 2019-04-09 NOTE — Telephone Encounter (Signed)
Medication was denied today.  This message sent yesterday.  Pt is past due for cpx and fasting lab work.  A message was released to Lindsay [name] on 01/23/2019.

## 2019-04-09 NOTE — Telephone Encounter (Signed)
Denied.  Message sent to the pt to schedule cpx on 01/23/2019.

## 2019-04-25 ENCOUNTER — Telehealth: Payer: Self-pay | Admitting: Adult Health

## 2019-04-25 NOTE — Telephone Encounter (Signed)
Copied from [place] [id number]. Topic: Appointment Scheduling - Scheduling Inquiry for Clinic >> Apr 10, 2019  2:49 PM Lindsay [name] wrote: Reason for CRM:   Pt was told she needs a visit for [facility] to refill medication and that she needs a CPE.  First available CPE is in December and pt doesn't want to wait that long.  She also doesn't want to have to pay for a med refill OV and wants to have the free CPE so that she can get her medication sent in for her.  Pt states that the office isn't listening to her and she would like to speak with someone who can help her resolve this. Pt can be reached at [phone]. >> Apr 10, 2019  6:15 PM Miles Costain T, CMA wrote: Lindsay [name],  Pt only needs cpx.  Please schedule in next available slot (within 90 days)  I will fill her medication for 90 days once scheduled.

## 2019-04-29 ENCOUNTER — Other Ambulatory Visit: Payer: Self-pay | Admitting: Adult Health

## 2019-04-30 NOTE — Telephone Encounter (Signed)
DENIED.  PAST DUE FOR CPX AND FASTING LAB WORK. 

## 2019-05-01 LAB — BASIC METABOLIC PANEL
BUN: 10 (ref 4–21)
CO2: 26 — AB (ref 13–22)
Chloride: 101 (ref 99–108)
Creatinine: 0.9 (ref <=1.1)
Potassium: 3.7 (ref 3.4–5.3)
Sodium: 138 (ref 137–147)

## 2019-05-01 LAB — TSH: TSH: 0.85 (ref <=5.90)

## 2019-05-01 LAB — COMPREHENSIVE METABOLIC PANEL
Albumin: 4.1 (ref 3.5–5.0)
Calcium: 9.2 (ref 8.7–10.7)
GFR calc non Af Amer: 68

## 2019-05-01 LAB — LIPID PANEL
Cholesterol: 157 (ref 0–200)
HDL: 60 (ref 35–70)
LDL Cholesterol: 86
LDl/HDL Ratio: 2.6
Triglycerides: 55 (ref 40–160)

## 2019-05-01 LAB — HEPATIC FUNCTION PANEL
ALT: 13 (ref 7–35)
Bilirubin, Total: 0.4

## 2019-06-03 ENCOUNTER — Ambulatory Visit: Payer: 59 | Admitting: Allergy and Immunology

## 2019-08-01 LAB — CBC AND DIFFERENTIAL
HCT: 36 (ref 36–46)
Hemoglobin: 11.7 — AB (ref 12.0–16.0)
Platelets: 266 (ref 150–399)
WBC: 5.1

## 2019-08-01 LAB — HEPATIC FUNCTION PANEL: AST: 15 (ref 13–35)

## 2019-08-01 LAB — IRON,TIBC AND FERRITIN PANEL
Iron: 7
TIBC: 34

## 2019-08-01 LAB — CBC: RBC: 4.22 (ref 3.87–5.11)

## 2019-08-27 ENCOUNTER — Encounter (INDEPENDENT_AMBULATORY_CARE_PROVIDER_SITE_OTHER): Payer: Self-pay | Admitting: Family Medicine

## 2019-08-27 ENCOUNTER — Ambulatory Visit (INDEPENDENT_AMBULATORY_CARE_PROVIDER_SITE_OTHER): Payer: 59 | Admitting: Family Medicine

## 2019-08-27 ENCOUNTER — Other Ambulatory Visit: Payer: Self-pay

## 2019-08-27 VITALS — BP 135/85 | HR 62 | Temp 98.3°F | Ht 61.0 in | Wt 163.0 lb

## 2019-08-27 DIAGNOSIS — R5383 Other fatigue: Secondary | ICD-10-CM | POA: Diagnosis not present

## 2019-08-27 DIAGNOSIS — E66811 Obesity, class 1: Secondary | ICD-10-CM

## 2019-08-27 DIAGNOSIS — I10 Essential (primary) hypertension: Secondary | ICD-10-CM | POA: Diagnosis not present

## 2019-08-27 DIAGNOSIS — Z9189 Other specified personal risk factors, not elsewhere classified: Secondary | ICD-10-CM | POA: Diagnosis not present

## 2019-08-27 DIAGNOSIS — R0683 Snoring: Secondary | ICD-10-CM

## 2019-08-27 DIAGNOSIS — R0602 Shortness of breath: Secondary | ICD-10-CM | POA: Diagnosis not present

## 2019-08-27 DIAGNOSIS — F3289 Other specified depressive episodes: Secondary | ICD-10-CM

## 2019-08-27 DIAGNOSIS — E669 Obesity, unspecified: Secondary | ICD-10-CM

## 2019-08-27 DIAGNOSIS — D508 Other iron deficiency anemias: Secondary | ICD-10-CM

## 2019-08-27 DIAGNOSIS — Z0289 Encounter for other administrative examinations: Secondary | ICD-10-CM

## 2019-08-27 DIAGNOSIS — Z6830 Body mass index (BMI) 30.0-30.9, adult: Secondary | ICD-10-CM

## 2019-08-27 DIAGNOSIS — E559 Vitamin D deficiency, unspecified: Secondary | ICD-10-CM

## 2019-08-27 NOTE — Progress Notes (Signed)
Dear Dr. Vincenza Hews,   Thank you for referring Lindsay [name] to our clinic. The following note includes my evaluation and treatment recommendations.  Chief Complaint:   OBESITY Lindsay [name] (MR# [id number]) is a 49 y.o. female who presents for evaluation and treatment of obesity and related comorbidities. Current BMI is Body mass index is 30.8 kg/m. Lindsay [name] has been struggling with her weight for many years and has been unsuccessful in either losing weight, maintaining weight loss, or reaching her healthy weight goal.  Lindsay [name] is currently in the action stage of change and ready to dedicate time achieving and maintaining a healthier weight. Lindsay [name] is interested in becoming our patient and working on intensive lifestyle modifications including (but not limited to) diet and exercise for weight loss.  Lindsay [name]'s habits were reviewed today and are as follows: Her family eats meals together, she thinks her family will eat healthier with her, her desired weight loss is 34 pounds, she started gaining weight 11 years ago, her heaviest weight ever was her current weight, she craves pasta, ice cream, candy, and cake, she snacks frequently in the evenings, she skips breakfast frequently, she is frequently drinking liquids with calories, she sometimes makes poor food choices, she frequently eats larger portions than normal and she struggles with emotional eating.  Depression Screen Lindsay [name]'s Food and Mood (modified PHQ-9) score was 11.  Depression screen PHQ 2/9 08/27/2019  Decreased Interest 0  Down, Depressed, Hopeless 1  PHQ - 2 Score 1  Altered sleeping 2  Tired, decreased energy 3  Change in appetite 2  Feeling bad or failure about yourself  0  Trouble concentrating 3  Moving slowly or fidgety/restless 0  Suicidal thoughts 0  PHQ-9 Score 11  Difficult doing work/chores Somewhat difficult   Subjective:   1. Other fatigue Lindsay [name] denies daytime somnolence and admits to waking up still  tired. Patent has a history of symptoms of morning fatigue and snoring. Lindsay [name] generally gets 8 hours of sleep per night, and states that she has poor sleep quality. Snoring is present. Apneic episodes are not present. Epworth Sleepiness Score is 4.  2. SOB (shortness of breath) on exertion [name] notes increasing shortness of breath with exercising and seems to be worsening over time with weight gain. She notes getting out of breath sooner with activity than she used to. This has gotten worse recently. [name] denies shortness of breath at rest or orthopnea.  3. Essential hypertension Review: taking medications as instructed, no medication side effects noted, no chest pain on exertion, no dyspnea on exertion, no swelling of ankles. She is taking olmesartan-HCTZ 20-12.5 daily for blood pressure control.  BP Readings from Last 3 Encounters:  08/27/19 135/85  10/30/18 (!) 141/82  11/02/17 (!) 147/100   4. Vitamin D deficiency Lindsay [name]'s Vitamin D level was 23.89 on 09/08/2017. She is currently taking vit D 1000 IU daily. She denies nausea, vomiting or muscle weakness.  5. Snoring Situation Chance of Dozing or Sleeping  Sitting and reading 0 = would never doze or sleep  Watching television 1 = slight chance of dozing or sleeping  Sitting inactive in a public place (theater or meeting) 0 = would never doze or sleep  Lying down in the afternoon when circumstances permit 2 = moderate chance of dozing or sleeping  Sitting and talking to someone 0 = would never doze or sleep  Sitting quietly after lunch without alcohol 0 = would never doze or sleep  Sitting as a passenger in  a car for an hour 1 = slight chance of dozing or sleeping  In a car, while stopped for a few minutes in traffic 0 = would never doze or sleep  TOTAL 4   6. Other iron deficiency anemia Lindsay [name] is not a vegetarian.  She does not have a history of weight loss surgery.  She is taking an iron supplement.  CBC Latest Ref Rng & Units  09/08/2017 08/31/2016 02/11/2016  WBC 4.0 - 10.5 K/uL 3.8(L) 3.7(L) 5.8  Hemoglobin 12.0 - 15.0 g/dL 16.6 11.9(L) 11.9(L)  Hematocrit 36.0 - 46.0 % 38.6 36.4 36.2  Platelets 150.0 - 400.0 K/uL 271.0 246.0 263   Lab Results  Component Value Date   IRON 65 09/08/2017   7. Other depression, with emotional eating Lindsay [name] is struggling with emotional eating and using food for comfort to the extent that it is negatively impacting her health. She has been working on behavior modification techniques to help reduce her emotional eating and has been unsuccessful. She shows no sign of suicidal or homicidal ideations.  PHQ-9 is 11 today.  8. At risk for heart disease Lindsay [name] is at a higher than average risk for cardiovascular disease due to obesity. Reviewed: no chest pain on exertion, no dyspnea on exertion, and no swelling of ankles.  Assessment/Plan:   1. Other fatigue [name] does feel that her weight is causing her energy to be lower than it should be. Fatigue may be related to obesity, depression or many other causes. Labs will be ordered, and in the meanwhile, [name] will focus on self care including making healthy food choices, increasing physical activity and focusing on stress reduction.  Orders - EKG 12-Lead  2. SOB (shortness of breath) on exertion [name] does feel that she gets out of breath more easily that she used to when she exercises. [name]'s shortness of breath appears to be obesity related and exercise induced. She has agreed to work on weight loss and gradually increase exercise to treat her exercise induced shortness of breath. Will continue to monitor closely.    3. Essential hypertension [name] is working on healthy weight loss and exercise to improve blood pressure control. We will watch for signs of hypotension as she continues her lifestyle modifications.  4. Vitamin D deficiency Low Vitamin D level contributes to fatigue and are associated with obesity, breast, and colon  cancer. She agrees to continue to take Vitamin D @1000  IU daily and will follow-up for routine testing of Vitamin D, at least 2-3 times per year to avoid over-replacement.  5. Snoring Counseling: Intensive lifestyle modifications are the first line treatment for this issue. We discussed several lifestyle modifications today and she will continue to work on diet, exercise and weight loss efforts. We will continue to monitor. Orders and follow up as documented in patient record.  6. Other iron deficiency anemia Orders and follow up as documented in patient record.  Counseling . Iron is essential for our bodies to make red blood cells.  Reasons that someone may be deficient include: an iron-deficient diet (more likely in those following vegan or vegetarian diets), women with heavy menses, patients with GI disorders or poor absorption, patients that have had bariatric surgery, frequent blood donors, patients with cancer, and patients with heart disease.   foods include dark leafy greens, red and white meats, eggs, seafood, and beans.   . Certain foods and drinks prevent your body from absorbing iron properly. Avoid eating these foods in the same meal  as iron-rich foods or with iron supplements. These foods include: coffee, black tea, and red wine; milk, dairy products, and foods that are high in calcium; beans and soybeans; whole grains.  . Constipation can be a side effect of iron supplementation. Increased water and fiber intake are helpful. Water goal: > 2 liters/day. Fiber goal: > 25 grams/day.  7. Other depression, with emotional eating Behavior modification techniques were discussed today to help Lindsay [name] deal with her emotional/non-hunger eating behaviors.  Orders and follow up as documented in patient record.   8. At risk for heart disease Lindsay [name] was given approximately 15 minutes of coronary artery disease prevention counseling today. She is 49 y.o. female and has risk factors for  heart disease including obesity. We discussed intensive lifestyle modifications today with an emphasis on specific weight loss instructions and strategies.   Repetitive spaced learning was employed today to elicit superior memory formation and behavioral change.  9. Class 1 obesity with serious comorbidity and body mass index (BMI) of 30.0 to 30.9 in adult, unspecified obesity type Lindsay [name] is currently in the action stage of change and her goal is to continue with weight loss efforts. I recommend Lindsay [name] begin the structured treatment plan as follows:  She has agreed to the Category 1 Plan.  Exercise goals: For substantial health benefits, adults should do at least 150 minutes (2 hours and 30 minutes) a week of moderate-intensity, or 75 minutes (1 hour and 15 minutes) a week of vigorous-intensity aerobic physical activity, or an equivalent combination of moderate- and vigorous-intensity aerobic activity. Aerobic activity should be performed in episodes of at least 10 minutes, and preferably, it should be spread throughout the week. Adults should also include muscle-strengthening activities that involve all major muscle groups on 2 or more days a week.   Behavioral modification strategies: increasing lean protein intake, decreasing simple carbohydrates, increasing vegetables and increasing water intake.  She was informed of the importance of frequent follow-up visits to maximize her success with intensive lifestyle modifications for her multiple health conditions. She was informed we would discuss her lab results at her next visit unless there is a critical issue that needs to be addressed sooner. Lindsay [name] agreed to keep her next visit at the agreed upon time to discuss these results.  Objective:   Blood pressure 135/85, pulse 62, temperature 98.3 F (36.8 C), temperature source Oral, height 5\' 1"  (1.549 m), weight 163 lb (73.9 kg), last menstrual period 08/01/2019, SpO2 100 %. Body mass index is 30.8  kg/m.  EKG: Normal sinus rhythm, rate 75 bpm.  Indirect Calorimeter completed today shows a VO2 of 188 and a REE of 1306.  Her calculated basal metabolic rate is 3762 thus her basal metabolic rate is worse than expected.  General: Cooperative, alert, well developed, in no acute distress. HEENT: Conjunctivae and lids unremarkable. Cardiovascular: Regular rhythm.  Lungs: Normal work of breathing. Neurologic: No focal deficits.   Lab Results  Component Value Date   CREATININE 0.86 09/08/2017   BUN 13 09/08/2017   NA 138 09/08/2017   K 4.0 09/08/2017   CL 103 09/08/2017   CO2 27 09/08/2017   Lab Results  Component Value Date   ALT 13 09/08/2017   AST 14 09/08/2017   ALKPHOS 46 09/08/2017   BILITOT 0.4 09/08/2017   Lab Results  Component Value Date   TSH 1.28 09/08/2017   Lab Results  Component Value Date   CHOL 151 09/08/2017   HDL 58.30 09/08/2017   LDLCALC 82  09/08/2017   TRIG 54.0 09/08/2017   CHOLHDL 3 09/08/2017   Lab Results  Component Value Date   WBC 3.8 (L) 09/08/2017   HGB 12.5 09/08/2017   HCT 38.6 09/08/2017   MCV 87.1 09/08/2017   PLT 271.0 09/08/2017   Lab Results  Component Value Date   IRON 65 09/08/2017   Attestation Statements:   This is the patient's first visit at [facility]. The patient's NEW PATIENT PACKET was reviewed at length. Included in the packet: current and past health history, medications, allergies, ROS, gynecologic history (women only), surgical history, family history, social history, weight history, weight loss surgery history (for those that have had weight loss surgery), nutritional evaluation, mood and food questionnaire, PHQ9, Epworth questionnaire, sleep habits questionnaire, patient life and health improvement goals questionnaire. These will all be scanned into the patient's chart under media.   During the visit, I independently reviewed the patient's EKG, bioimpedance scale results, and indirect  calorimeter results. I used this information to tailor a meal plan for the patient that will help her to lose weight and will improve her obesity-related conditions going forward. I performed a medically necessary appropriate examination and/or evaluation. I discussed the assessment and treatment plan with the patient. The patient was provided an opportunity to ask questions and all were answered. The patient agreed with the plan and demonstrated an understanding of the instructions. Labs were ordered at this visit and will be reviewed at the next visit unless more critical results need to be addressed immediately. I communicated with the referring physician. Clinical information was updated and documented in the EMR.   A separate 15 minutes was spent on risk counseling (see above).   I, Insurance claims handler, CMA, am acting as Energy manager for [employer], DO.  I have reviewed the above documentation for accuracy and completeness, and I agree with the above. Helane Rima, DO

## 2019-09-03 ENCOUNTER — Encounter (INDEPENDENT_AMBULATORY_CARE_PROVIDER_SITE_OTHER): Payer: Self-pay | Admitting: *Deleted

## 2019-09-10 ENCOUNTER — Ambulatory Visit (INDEPENDENT_AMBULATORY_CARE_PROVIDER_SITE_OTHER): Payer: 59 | Admitting: Family Medicine

## 2019-09-10 ENCOUNTER — Encounter (INDEPENDENT_AMBULATORY_CARE_PROVIDER_SITE_OTHER): Payer: Self-pay | Admitting: Family Medicine

## 2019-09-10 ENCOUNTER — Other Ambulatory Visit: Payer: Self-pay

## 2019-09-10 VITALS — BP 119/79 | HR 69 | Temp 98.6°F | Ht 61.0 in | Wt 157.0 lb

## 2019-09-10 DIAGNOSIS — Z9189 Other specified personal risk factors, not elsewhere classified: Secondary | ICD-10-CM

## 2019-09-10 DIAGNOSIS — E559 Vitamin D deficiency, unspecified: Secondary | ICD-10-CM | POA: Diagnosis not present

## 2019-09-10 DIAGNOSIS — I10 Essential (primary) hypertension: Secondary | ICD-10-CM | POA: Diagnosis not present

## 2019-09-10 DIAGNOSIS — R739 Hyperglycemia, unspecified: Secondary | ICD-10-CM | POA: Diagnosis not present

## 2019-09-10 DIAGNOSIS — Z6830 Body mass index (BMI) 30.0-30.9, adult: Secondary | ICD-10-CM

## 2019-09-10 DIAGNOSIS — D508 Other iron deficiency anemias: Secondary | ICD-10-CM

## 2019-09-10 DIAGNOSIS — F3289 Other specified depressive episodes: Secondary | ICD-10-CM

## 2019-09-10 DIAGNOSIS — E669 Obesity, unspecified: Secondary | ICD-10-CM

## 2019-09-10 NOTE — Progress Notes (Signed)
Chief Complaint:   OBESITY Lindsay [name] is here to discuss her progress with her obesity treatment plan along with follow-up of her obesity related diagnoses. Lindsay [name] is on the Category 1 Plan and states she is following her eating plan approximately 30% of the time. Lindsay [name] states she is exercising for 0 minutes 0 times per week.  Today's visit was #: 2 Starting weight: 163 lbs Starting date: 08/27/2019 Today's weight: 157 lbs Today's date: 09/10/2019 Total lbs lost to date: 6 lbs Total lbs lost since last in-office visit: 6 lbs  Interim History: Lindsay [name] says she has been following the plan for breakfast daily and has been following the plan most days for lunch.  Her coworkers sabotage her at times.  She struggles with dinner.  She reports being hungry between lunch and dinner.  She has not been drinking enough water.  Subjective:   1. Other iron deficiency anemia Lindsay [name] is not a vegetarian.  She does not have a history of weight loss surgery.   CBC Latest Ref Rng & Units 08/01/2019 09/08/2017 08/31/2016  WBC - 5.1 3.8(L) 3.7(L)  Hemoglobin 12.0 - 16.0 11.7(A) 12.5 11.9(L)  Hematocrit 36 - 46 36 38.6 36.4  Platelets 150 - 399 266 271.0 246.0   Lab Results  Component Value Date   IRON 7 08/01/2019   TIBC 34 08/01/2019   2. Vitamin D deficiency Season's Vitamin D level was 23.89 on 09/08/2017. She is currently taking vit D. She denies nausea, vomiting or muscle weakness.  3. Hyperglycemia Lindsay [name] has a history of some elevated blood glucose readings without a diagnosis of diabetes.   4. Essential hypertension Review: taking medications as instructed, no medication side effects noted, no chest pain on exertion, no dyspnea on exertion, no swelling of ankles.  Lindsay [name] takes olmesartan for blood pressure.  BP Readings from Last 3 Encounters:  09/10/19 119/79  08/27/19 135/85  10/30/18 (!) 141/82   5. Other depression, with emotional eating Lindsay [name] is struggling with emotional eating and  using food for comfort to the extent that it is negatively impacting her health. She has been working on behavior modification techniques to help reduce her emotional eating and has been unsuccessful. She shows no sign of suicidal or homicidal ideations.    6. At risk for dehydration Lindsay [name] is at risk for dehydration due to decreased intake of water.  Assessment/Plan:   1. Other iron deficiency anemia Orders and follow up as documented in patient record.  Counseling . Iron is essential for our bodies to make red blood cells.  Reasons that someone may be deficient include: an iron-deficient diet (more likely in those following vegan or vegetarian diets), women with heavy menses, patients with GI disorders or poor absorption, patients that have had bariatric surgery, frequent blood donors, patients with cancer, and patients with heart disease.   Lindsay [name] foods include dark leafy greens, red and white meats, eggs, seafood, and beans.   . Certain foods and drinks prevent your body from absorbing iron properly. Avoid eating these foods in the same meal as iron-rich foods or with iron supplements. These foods include: coffee, black tea, and red wine; milk, dairy products, and foods that are high in calcium; beans and soybeans; whole grains.  . Constipation can be a side effect of iron supplementation. Increased water and fiber intake are helpful. Water goal: > 2 liters/day. Fiber goal: > 25 grams/day.  2. Vitamin D deficiency Low Vitamin D level contributes to fatigue and are associated  with obesity, breast, and colon cancer. She agrees to continue to take Vitamin D @1 ,000 IU daily and will follow-up for routine testing of Vitamin D, at least 2-3 times per year to avoid over-replacement.  Orders - VITAMIN D 25 Hydroxy (Vit-D Deficiency, Fractures)  3. Hyperglycemia Fasting labs will be obtained and results with be discussed with [name] in 2 weeks at her follow up visit. In the meanwhile Lindsay [name]  was started on a lower simple carbohydrate diet and will work on weight loss efforts.  Orders - Insulin, random  4. Essential hypertension Lindsay [name] is working on healthy weight loss and exercise to improve blood pressure control. We will watch for signs of hypotension as she continues her lifestyle modifications.  5. Other depression, with emotional eating Behavior modification techniques were discussed today to help Lindsay [name] deal with her emotional/non-hunger eating behaviors.  Orders and follow up as documented in patient record.   6. At risk for dehydration Lindsay [name] was given approximately 15 minutes dehydration prevention counseling today. Lindsay [name] is at risk for dehydration due to weight loss and current medication(s). She was encouraged to hydrate and monitor fluid status to avoid dehydration as well as weight loss plateaus.   7. Class 1 obesity with serious comorbidity and body mass index (BMI) of 30.0 to 30.9 in adult, unspecified obesity type Lindsay [name] is currently in the action stage of change. As such, her goal is to continue with weight loss efforts. She has agreed to the Category 1 Plan.  We reviewed dinner calorie goals.  Okay for her to have 100 calorie coffee drink at 2-3 pm.  Exercise goals: No exercise has been prescribed at this time.  Behavioral modification strategies: increasing lean protein intake and increasing water intake.  Lindsay [name] has agreed to follow-up with our clinic in 2 weeks. She was informed of the importance of frequent follow-up visits to maximize her success with intensive lifestyle modifications for her multiple health conditions.   Lindsay [name] was informed we would discuss her lab results at her next visit unless there is a critical issue that needs to be addressed sooner. Lindsay [name] agreed to keep her next visit at the agreed upon time to discuss these results.  Objective:   Blood pressure 119/79, pulse 69, temperature 98.6 F (37 C), temperature source Oral, height 5'  1" (1.549 m), weight 157 lb (71.2 kg), last menstrual period 09/02/2019, SpO2 99 %. Body mass index is 29.66 kg/m.  General: Cooperative, alert, well developed, in no acute distress. HEENT: Conjunctivae and lids unremarkable. Cardiovascular: Regular rhythm.  Lungs: Normal work of breathing. Neurologic: No focal deficits.   Lab Results  Component Value Date   CREATININE 0.9 05/01/2019   BUN 10 05/01/2019   NA 138 05/01/2019   K 3.7 05/01/2019   CL 101 05/01/2019   CO2 26 (A) 05/01/2019   Lab Results  Component Value Date   ALT 13 05/01/2019   AST 15 08/01/2019   ALKPHOS 46 09/08/2017   BILITOT 0.4 09/08/2017   Lab Results  Component Value Date   TSH 0.85 05/01/2019   Lab Results  Component Value Date   CHOL 157 05/01/2019   HDL 60 05/01/2019   LDLCALC 86 05/01/2019   TRIG 55 05/01/2019   CHOLHDL 3 09/08/2017   Lab Results  Component Value Date   WBC 5.1 08/01/2019   HGB 11.7 (A) 08/01/2019   HCT 36 08/01/2019   MCV 87.1 09/08/2017   PLT 266 08/01/2019   Lab Results  Component Value Date  IRON 7 08/01/2019   TIBC 34 08/01/2019   Attestation Statements:   Reviewed by clinician on day of visit: allergies, medications, problem list, medical history, surgical history, family history, social history, and previous encounter notes.  I, Insurance claims handler, CMA, am acting as Energy manager for [employer], DO.  I have reviewed the above documentation for accuracy and completeness, and I agree with the above. Helane Rima, DO

## 2019-09-11 LAB — HEMOGLOBIN A1C
Est. average glucose Bld gHb Est-mCnc: 108 mg/dL
Hgb A1c MFr Bld: 5.4 % (ref 4.8–5.6)

## 2019-09-11 LAB — VITAMIN D 25 HYDROXY (VIT D DEFICIENCY, FRACTURES): Vit D, 25-Hydroxy: 34.3 ng/mL (ref 30.0–100.0)

## 2019-09-11 LAB — INSULIN, RANDOM: INSULIN: 19.8 u[IU]/mL (ref 2.6–24.9)

## 2019-09-25 ENCOUNTER — Encounter (INDEPENDENT_AMBULATORY_CARE_PROVIDER_SITE_OTHER): Payer: Self-pay | Admitting: Family Medicine

## 2019-09-25 ENCOUNTER — Ambulatory Visit (INDEPENDENT_AMBULATORY_CARE_PROVIDER_SITE_OTHER): Payer: 59 | Admitting: Family Medicine

## 2019-09-25 ENCOUNTER — Other Ambulatory Visit: Payer: Self-pay

## 2019-09-25 VITALS — BP 121/77 | HR 64 | Temp 98.7°F | Ht 61.0 in | Wt 156.0 lb

## 2019-09-25 DIAGNOSIS — Z9189 Other specified personal risk factors, not elsewhere classified: Secondary | ICD-10-CM | POA: Diagnosis not present

## 2019-09-25 DIAGNOSIS — E8881 Metabolic syndrome: Secondary | ICD-10-CM | POA: Diagnosis not present

## 2019-09-25 DIAGNOSIS — E669 Obesity, unspecified: Secondary | ICD-10-CM | POA: Diagnosis not present

## 2019-09-25 DIAGNOSIS — F3289 Other specified depressive episodes: Secondary | ICD-10-CM | POA: Diagnosis not present

## 2019-09-25 DIAGNOSIS — Z6830 Body mass index (BMI) 30.0-30.9, adult: Secondary | ICD-10-CM

## 2019-09-25 MED ORDER — BUPROPION HCL ER (SR) 150 MG PO TB12: 150.0000 mg | ORAL_TABLET | Freq: Every day | ORAL | 0 refills | Status: DC

## 2019-09-26 NOTE — Progress Notes (Signed)
Chief Complaint:   OBESITY Lindsay [name] is here to discuss her progress with her obesity treatment plan along with follow-up of her obesity related diagnoses. Lindsay [name] is on the Category 1 Plan and states she is following her eating plan approximately 50% of the time. Lindsay [name] states she is doing 0 minutes 0 times per week.  Today's visit was #: 3 Starting weight: 163 lbs Starting date: 08/27/2019 Today's weight: 156 lbs Today's date: 09/25/2019 Total lbs lost to date: 7 Total lbs lost since last in-office visit: 1  Interim History: Lindsay [name] has lost 1 lb since her last visit. She isn't sure what her dinner goals are outside of decreased calories. She is struggling with emotional eating.  Subjective:   1. Insulin resistance [name] has a new diagnosis of insulin resistance. She has a normal glucose but elevated fasting insulin consistent with insulin resistance. She is working on diet and weight loss. She denies hypoglycemia. I discussed labs with the patient today.  2. Other depression, with emotional eating Lindsay [name] is struggling with stress and comfort eating, especially in the PM. She is frustrated that this is still an issue.  3. At risk for diabetes mellitus Lindsay [name] is at higher than average risk for developing diabetes due to her obesity.   Assessment/Plan:   1. Insulin resistance Lindsay [name] will continue to work on weight loss, diet, exercise, and decreasing simple carbohydrates to help decrease the risk of diabetes. We will recheck labs in 2 months. Lindsay [name] agreed to follow-up with [place] as directed to closely monitor her progress.  2. Other depression, with emotional eating Cognitive behavioral therapy were discussed today to help Lindsay [name] deal with her emotional/non-hunger eating behaviors. Lindsay [name] agreed to start Wellbutrin SR 150 mg daily with no refills. Orders and follow up as documented in patient record.   - buPROPion (WELLBUTRIN SR) 150 MG 12 hr tablet; Take 1 tablet (150 mg total) by  mouth daily.  Dispense: 30 tablet; Refill: 0  3. At risk for diabetes mellitus Lindsay [name] was given approximately 15 minutes of diabetes education and counseling today. We discussed intensive lifestyle modifications today with an emphasis on weight loss as well as increasing exercise and decreasing simple carbohydrates in her diet. We also reviewed medication options with an emphasis on risk versus benefit of those discussed.   Repetitive spaced learning was employed today to elicit superior memory formation and behavioral change.  4. Class 1 obesity with serious comorbidity and body mass index (BMI) of 30.0 to 30.9 in adult, unspecified obesity type Lindsay [name] is currently in the action stage of change. As such, her goal is to continue with weight loss efforts. She has agreed to the Category 1 Plan and keeping a food journal and adhering to recommended goals of 300-450 calories and 30+ grams of protein at supper daily.   Behavioral modification strategies: increasing lean protein intake.  Lindsay [name] has agreed to follow-up with our clinic in 2 to 3 weeks with Dr. Juleen China. She was informed of the importance of frequent follow-up visits to maximize her success with intensive lifestyle modifications for her multiple health conditions.   Objective:   Blood pressure 121/77, pulse 64, temperature 98.7 F (37.1 C), temperature source Oral, height 5\' 1"  (1.549 m), weight 156 lb (70.8 kg), last menstrual period 09/02/2019, SpO2 100 %. Body mass index is 29.48 kg/m.  General: Cooperative, alert, well developed, in no acute distress. HEENT: Conjunctivae and lids unremarkable. Cardiovascular: Regular rhythm.  Lungs: Normal work of breathing. Neurologic: No focal deficits.  Lab Results  Component Value Date   CREATININE 0.9 05/01/2019   BUN 10 05/01/2019   NA 138 05/01/2019   K 3.7 05/01/2019   CL 101 05/01/2019   CO2 26 (A) 05/01/2019   Lab Results  Component Value Date   ALT 13 05/01/2019   AST 15  08/01/2019   ALKPHOS 46 09/08/2017   BILITOT 0.4 09/08/2017   Lab Results  Component Value Date   HGBA1C 5.4 09/10/2019   Lab Results  Component Value Date   INSULIN 19.8 09/10/2019   Lab Results  Component Value Date   TSH 0.85 05/01/2019   Lab Results  Component Value Date   CHOL 157 05/01/2019   HDL 60 05/01/2019   LDLCALC 86 05/01/2019   TRIG 55 05/01/2019   CHOLHDL 3 09/08/2017   Lab Results  Component Value Date   WBC 5.1 08/01/2019   HGB 11.7 (A) 08/01/2019   HCT 36 08/01/2019   MCV 87.1 09/08/2017   PLT 266 08/01/2019   Lab Results  Component Value Date   IRON 7 08/01/2019   TIBC 34 08/01/2019   Attestation Statements:   Reviewed by clinician on day of visit: allergies, medications, problem list, medical history, surgical history, family history, social history, and previous encounter notes.   I, Burt Knack, am acting as transcriptionist for Quillian Quince, MD.  I have reviewed the above documentation for accuracy and completeness, and I agree with the above. -  Quillian Quince, MD

## 2019-10-16 ENCOUNTER — Other Ambulatory Visit: Payer: Self-pay

## 2019-10-16 ENCOUNTER — Ambulatory Visit (INDEPENDENT_AMBULATORY_CARE_PROVIDER_SITE_OTHER): Payer: 59 | Admitting: Family Medicine

## 2019-10-16 ENCOUNTER — Encounter (INDEPENDENT_AMBULATORY_CARE_PROVIDER_SITE_OTHER): Payer: Self-pay | Admitting: Family Medicine

## 2019-10-16 VITALS — BP 111/73 | HR 62 | Temp 98.5°F | Ht 61.0 in | Wt 153.0 lb

## 2019-10-16 DIAGNOSIS — I10 Essential (primary) hypertension: Secondary | ICD-10-CM

## 2019-10-16 DIAGNOSIS — F3289 Other specified depressive episodes: Secondary | ICD-10-CM

## 2019-10-16 DIAGNOSIS — E559 Vitamin D deficiency, unspecified: Secondary | ICD-10-CM | POA: Diagnosis not present

## 2019-10-16 DIAGNOSIS — E8881 Metabolic syndrome: Secondary | ICD-10-CM | POA: Diagnosis not present

## 2019-10-16 DIAGNOSIS — E88819 Insulin resistance, unspecified: Secondary | ICD-10-CM

## 2019-10-16 DIAGNOSIS — E66811 Obesity, class 1: Secondary | ICD-10-CM

## 2019-10-16 DIAGNOSIS — E669 Obesity, unspecified: Secondary | ICD-10-CM

## 2019-10-16 DIAGNOSIS — Z6830 Body mass index (BMI) 30.0-30.9, adult: Secondary | ICD-10-CM

## 2019-10-16 NOTE — Progress Notes (Signed)
Chief Complaint:   OBESITY Lindsay [name] is here to discuss her progress with her obesity treatment plan along with follow-up of her obesity related diagnoses. [name] is on the Category 1 Plan and states she is following her eating plan approximately 70-75% of the time. [name] states she is exercising for 0 minutes 0 times per week.  Today's visit was #: 4 Starting weight: 163 lbs Starting date: 08/27/2019 Today's weight: 153 lbs Today's date: 10/16/2019 Total lbs lost to date: 10 lbs Total lbs lost since last in-office visit: 3 lbs  Interim History: Praise reports that she is still struggling with dinner.  Subjective:   1. Vitamin D deficiency Lindsay [name]'s Vitamin D level was 34.3 on 09/10/2019. She is currently taking OTC vitamin D 1000 each day. She denies nausea, vomiting or muscle weakness.  2. Essential hypertension Review: taking medications as instructed, no medication side effects noted, no chest pain on exertion, no dyspnea on exertion, no swelling of ankles.   BP Readings from Last 3 Encounters:  10/16/19 111/73  09/25/19 121/77  09/10/19 119/79   3. Insulin resistance [name] has a diagnosis of insulin resistance based on her elevated fasting insulin level >5. She continues to work on diet and exercise to decrease her risk of diabetes.  Lab Results  Component Value Date   INSULIN 19.8 09/10/2019   Lab Results  Component Value Date   HGBA1C 5.4 09/10/2019   4. Other depression, with emotional eating Lindsay [name] is struggling with emotional eating and using food for comfort to the extent that it is negatively impacting her health. She has been working on behavior modification techniques to help reduce her emotional eating and has been minimally successful. She shows no sign of suicidal or homicidal ideations.  She did not take the Wellbutrin.  Assessment/Plan:   1. Vitamin D deficiency Low Vitamin D level contributes to fatigue and are associated with obesity, breast, and  colon cancer. She agrees to continue to take Vitamin D @1 ,000 IU daily and will follow-up for routine testing of Vitamin D, at least 2-3 times per year to avoid over-replacement.  2. Essential hypertension Lindsay [name] is working on healthy weight loss and exercise to improve blood pressure control. We will watch for signs of hypotension as she continues her lifestyle modifications.  3. Insulin resistance Lindsay [name] will continue to work on weight loss, exercise, and decreasing simple carbohydrates to help decrease the risk of diabetes. Lindsay [name] agreed to follow-up with [place] as directed to closely monitor her progress.  4. Other depression, with emotional eating Behavior modification techniques were discussed today to help [name] deal with her emotional/non-hunger eating behaviors.  Orders and follow up as documented in patient record.   5. Class 1 obesity with serious comorbidity and body mass index (BMI) of 30.0 to 30.9 in adult, unspecified obesity type [name] is currently in the action stage of change. As such, her goal is to continue with weight loss efforts. She has agreed to the Category 1 Plan and keeping a food journal and adhering to recommended goals of 500 calories and 35+ grams of protein at supper.   Exercise goals: The patient will make a list of enjoyable activities.  Behavioral modification strategies: increasing lean protein intake.  [name] has agreed to follow-up with our clinic in 2 weeks. She was informed of the importance of frequent follow-up visits to maximize her success with intensive lifestyle modifications for her multiple health conditions.   Objective:   Blood pressure 111/73, pulse 62, temperature  98.5 F (36.9 C), temperature source Oral, height 5\' 1"  (1.549 m), weight 153 lb (69.4 kg), last menstrual period 09/27/2019, SpO2 100 %. Body mass index is 28.91 kg/m.  General: Cooperative, alert, well developed, in no acute distress. HEENT: Conjunctivae and lids  unremarkable. Cardiovascular: Regular rhythm.  Lungs: Normal work of breathing. Neurologic: No focal deficits.   Lab Results  Component Value Date   CREATININE 0.9 05/01/2019   BUN 10 05/01/2019   NA 138 05/01/2019   K 3.7 05/01/2019   CL 101 05/01/2019   CO2 26 (A) 05/01/2019   Lab Results  Component Value Date   ALT 13 05/01/2019   AST 15 08/01/2019   ALKPHOS 46 09/08/2017   BILITOT 0.4 09/08/2017   Lab Results  Component Value Date   HGBA1C 5.4 09/10/2019   Lab Results  Component Value Date   INSULIN 19.8 09/10/2019   Lab Results  Component Value Date   TSH 0.85 05/01/2019   Lab Results  Component Value Date   CHOL 157 05/01/2019   HDL 60 05/01/2019   LDLCALC 86 05/01/2019   TRIG 55 05/01/2019   CHOLHDL 3 09/08/2017   Lab Results  Component Value Date   WBC 5.1 08/01/2019   HGB 11.7 (A) 08/01/2019   HCT 36 08/01/2019   MCV 87.1 09/08/2017   PLT 266 08/01/2019   Lab Results  Component Value Date   IRON 7 08/01/2019   TIBC 34 08/01/2019   Attestation Statements:   Reviewed by clinician on day of visit: allergies, medications, problem list, medical history, surgical history, family history, social history, and previous encounter notes.  I, 08/03/2019, CMA, am acting as Insurance claims handler for Energy manager, DO.  I have reviewed the above documentation for accuracy and completeness, and I agree with the above. [employer], DO

## 2019-11-04 ENCOUNTER — Other Ambulatory Visit (INDEPENDENT_AMBULATORY_CARE_PROVIDER_SITE_OTHER): Payer: Self-pay | Admitting: Family Medicine

## 2019-11-04 DIAGNOSIS — F3289 Other specified depressive episodes: Secondary | ICD-10-CM

## 2019-11-05 ENCOUNTER — Encounter (INDEPENDENT_AMBULATORY_CARE_PROVIDER_SITE_OTHER): Payer: Self-pay

## 2019-11-11 ENCOUNTER — Other Ambulatory Visit: Payer: Self-pay

## 2019-11-11 ENCOUNTER — Ambulatory Visit (INDEPENDENT_AMBULATORY_CARE_PROVIDER_SITE_OTHER): Payer: 59 | Admitting: Family Medicine

## 2019-11-11 ENCOUNTER — Encounter (INDEPENDENT_AMBULATORY_CARE_PROVIDER_SITE_OTHER): Payer: Self-pay | Admitting: Family Medicine

## 2019-11-11 VITALS — BP 119/79 | HR 65 | Temp 98.6°F | Ht 61.0 in | Wt 149.0 lb

## 2019-11-11 DIAGNOSIS — Z6830 Body mass index (BMI) 30.0-30.9, adult: Secondary | ICD-10-CM

## 2019-11-11 DIAGNOSIS — I10 Essential (primary) hypertension: Secondary | ICD-10-CM

## 2019-11-11 DIAGNOSIS — F3289 Other specified depressive episodes: Secondary | ICD-10-CM

## 2019-11-11 DIAGNOSIS — E8881 Metabolic syndrome: Secondary | ICD-10-CM

## 2019-11-11 DIAGNOSIS — E559 Vitamin D deficiency, unspecified: Secondary | ICD-10-CM | POA: Diagnosis not present

## 2019-11-11 DIAGNOSIS — E669 Obesity, unspecified: Secondary | ICD-10-CM

## 2019-11-12 NOTE — Progress Notes (Signed)
Chief Complaint:   OBESITY Lindsay [name] is here to discuss her progress with her obesity treatment plan along with follow-up of her obesity related diagnoses. Lindsay [name] is on the Category 1 Plan and states she is following her eating plan approximately 70% of the time. Lindsay [name] states she is exercising for 0 minutes 0 times per week.  Today's visit was #: 5 Starting weight: 163 lbs Starting date: 08/27/2019 Today's weight: 149 lbs Today's date: 11/11/2019 Total lbs lost to date: 14 lbs Total lbs lost since last in-office visit: 4 lbs  Interim History: Lindsay [name] has struggled with cravings.  She says they are worse when she is premenstrual.  She reports that Wellbutrin caused headache and nausea.  Subjective:   1. Insulin resistance Lindsay [name] has a diagnosis of insulin resistance based on her elevated fasting insulin level >5. She continues to work on diet and exercise to decrease her risk of diabetes.  Lab Results  Component Value Date   INSULIN 19.8 09/10/2019   Lab Results  Component Value Date   HGBA1C 5.4 09/10/2019   2. Essential hypertension Review: taking medications as instructed, no medication side effects noted, no chest pain on exertion, no dyspnea on exertion, no swelling of ankles. Blood pressure is at goal.   BP Readings from Last 3 Encounters:  11/11/19 119/79  10/16/19 111/73  09/25/19 121/77   3. Vitamin D deficiency Lindsay [name]'s Vitamin D level was 34.3 on 09/10/2019. She is currently taking OTC vitamin D 1000 IU each day. She denies nausea, vomiting or muscle weakness.  4. Other depression, with emotional eating Lindsay [name] endorses cravings.  These seem to be menses-related and when she has not eaten in more than 4 hours.    Assessment/Plan:   1. Insulin resistance Lindsay [name] will continue to work on weight loss, exercise, and decreasing simple carbohydrates to help decrease the risk of diabetes. Lindsay [name] agreed to follow-up with [place] as directed to closely monitor her  progress.  2. Essential hypertension Lindsay [name] is working on healthy weight loss and exercise to improve blood pressure control. We will watch for signs of hypotension as she continues her lifestyle modifications.  3. Vitamin D deficiency Low Vitamin D level contributes to fatigue and are associated with obesity, breast, and colon cancer. She agrees to continue to take OTC Vitamin D @1000  IU daily and will follow-up for routine testing of Vitamin D, at least 2-3 times per year to avoid over-replacement.  4. Other depression, with emotional eating Reviewed importance of regular meals as well as increasing protein and increasing water.  5. Class 1 obesity with serious comorbidity and body mass index (BMI) of 30.0 to 30.9 in adult, unspecified obesity type Lindsay [name] is currently in the action stage of change. As such, her goal is to continue with weight loss efforts. She has agreed to the Category 1 Plan.   Exercise goals: For substantial health benefits, adults should do at least 150 minutes (2 hours and 30 minutes) a week of moderate-intensity, or 75 minutes (1 hour and 15 minutes) a week of vigorous-intensity aerobic physical activity, or an equivalent combination of moderate- and vigorous-intensity aerobic activity. Aerobic activity should be performed in episodes of at least 10 minutes, and preferably, it should be spread throughout the week.  Behavioral modification strategies: increasing lean protein intake.  Lindsay [name] has agreed to follow-up with our clinic in 2 weeks. She was informed of the importance of frequent follow-up visits to maximize her success with intensive lifestyle modifications for her multiple health  conditions.   Objective:   Blood pressure 119/79, pulse 65, temperature 98.6 F (37 C), temperature source Oral, height 5\' 1"  (1.549 m), weight 149 lb (67.6 kg), last menstrual period 10/13/2019, SpO2 100 %. Body mass index is 28.15 kg/m.  General: Cooperative, alert, well  developed, in no acute distress. HEENT: Conjunctivae and lids unremarkable. Cardiovascular: Regular rhythm.  Lungs: Normal work of breathing. Neurologic: No focal deficits.   Lab Results  Component Value Date   CREATININE 0.9 05/01/2019   BUN 10 05/01/2019   NA 138 05/01/2019   K 3.7 05/01/2019   CL 101 05/01/2019   CO2 26 (A) 05/01/2019   Lab Results  Component Value Date   ALT 13 05/01/2019   AST 15 08/01/2019   ALKPHOS 46 09/08/2017   BILITOT 0.4 09/08/2017   Lab Results  Component Value Date   HGBA1C 5.4 09/10/2019   Lab Results  Component Value Date   INSULIN 19.8 09/10/2019   Lab Results  Component Value Date   TSH 0.85 05/01/2019   Lab Results  Component Value Date   CHOL 157 05/01/2019   HDL 60 05/01/2019   LDLCALC 86 05/01/2019   TRIG 55 05/01/2019   CHOLHDL 3 09/08/2017   Lab Results  Component Value Date   WBC 5.1 08/01/2019   HGB 11.7 (A) 08/01/2019   HCT 36 08/01/2019   MCV 87.1 09/08/2017   PLT 266 08/01/2019   Lab Results  Component Value Date   IRON 7 08/01/2019   TIBC 34 08/01/2019   Attestation Statements:   Reviewed by clinician on day of visit: allergies, medications, problem list, medical history, surgical history, family history, social history, and previous encounter notes.  Time spent on visit including pre-visit chart review and post-visit care and charting was 45 minutes.   I, 08/03/2019, CMA, am acting as Insurance claims handler for Energy manager, DO.  I have reviewed the above documentation for accuracy and completeness, and I agree with the above. [employer], DO

## 2019-12-04 ENCOUNTER — Encounter (INDEPENDENT_AMBULATORY_CARE_PROVIDER_SITE_OTHER): Payer: Self-pay | Admitting: Family Medicine

## 2019-12-04 ENCOUNTER — Ambulatory Visit (INDEPENDENT_AMBULATORY_CARE_PROVIDER_SITE_OTHER): Payer: 59 | Admitting: Family Medicine

## 2019-12-04 ENCOUNTER — Other Ambulatory Visit: Payer: Self-pay

## 2019-12-04 VITALS — BP 116/76 | HR 64 | Temp 98.2°F | Ht 61.0 in | Wt 150.0 lb

## 2019-12-04 DIAGNOSIS — E669 Obesity, unspecified: Secondary | ICD-10-CM | POA: Diagnosis not present

## 2019-12-04 DIAGNOSIS — Z6830 Body mass index (BMI) 30.0-30.9, adult: Secondary | ICD-10-CM | POA: Diagnosis not present

## 2019-12-04 DIAGNOSIS — F3289 Other specified depressive episodes: Secondary | ICD-10-CM

## 2019-12-04 MED ORDER — BUPROPION HCL ER (SR) 200 MG PO TB12: 200.0000 mg | ORAL_TABLET | Freq: Every morning | ORAL | 0 refills | Status: DC

## 2019-12-05 DIAGNOSIS — F32A Depression, unspecified: Secondary | ICD-10-CM | POA: Insufficient documentation

## 2019-12-05 DIAGNOSIS — Z6830 Body mass index (BMI) 30.0-30.9, adult: Secondary | ICD-10-CM | POA: Insufficient documentation

## 2019-12-05 NOTE — Progress Notes (Signed)
Chief Complaint:   OBESITY Donetta is here to discuss her progress with her obesity treatment plan along with follow-up of her obesity related diagnoses. Season is on the Category 1 Plan and states she is following her eating plan approximately 60% of the time. Zurisadai states she is doing 0 minutes 0 times per week.  Today's visit was #: 6 Starting weight: 163 lbs Starting date: 08/27/2019 Today's weight: 150 lbs Today's date: 12/04/2019 Total lbs lost to date: 13 Total lbs lost since last in-office visit: 0  Interim History: Marialice is retaining a bit of water today. She has done well maintaining her weight, but still struggles with PM snacking of sweets and simple carbohydrates.  Subjective:   1. Other depression with emotional eating Emer tried Wellbutrin in the past but stopped as she didn't feel it helped. She was on a low dose previously but had no negative effects.  Assessment/Plan:   1. Other depression with emotional eating Behavior modification techniques were discussed today to help Lorijean deal with her emotional/non-hunger eating behaviors. Nataline agreed to start Wellbutrin SR 200 mg q daily with no refills. Orders and follow up as documented in patient record.   - buPROPion (WELLBUTRIN SR) 200 MG 12 hr tablet; Take 1 tablet (200 mg total) by mouth every morning.  Dispense: 30 tablet; Refill: 0  2. Class 1 obesity with serious comorbidity and body mass index (BMI) of 30.0 to 30.9 in adult, unspecified obesity type Grace is currently in the action stage of change. As such, her goal is to continue with weight loss efforts. She has agreed to the Category 1 Plan or keeping a food journal and adhering to recommended goals of 1000 calories and 75+ grams of protein daily.   Behavioral modification strategies: increasing lean protein intake, no skipping meals and better snacking choices.  Adrielle has agreed to follow-up with our clinic in 3 weeks. She was informed of the  importance of frequent follow-up visits to maximize her success with intensive lifestyle modifications for her multiple health conditions.   Objective:   Blood pressure 116/76, pulse 64, temperature 98.2 F (36.8 C), temperature source Oral, height 5\' 1"  (1.549 m), weight 150 lb (68 kg), SpO2 100 %. Body mass index is 28.34 kg/m.  General: Cooperative, alert, well developed, in no acute distress. HEENT: Conjunctivae and lids unremarkable. Cardiovascular: Regular rhythm.  Lungs: Normal work of breathing. Neurologic: No focal deficits.   Lab Results  Component Value Date   CREATININE 0.9 05/01/2019   BUN 10 05/01/2019   NA 138 05/01/2019   K 3.7 05/01/2019   CL 101 05/01/2019   CO2 26 (A) 05/01/2019   Lab Results  Component Value Date   ALT 13 05/01/2019   AST 15 08/01/2019   ALKPHOS 46 09/08/2017   BILITOT 0.4 09/08/2017   Lab Results  Component Value Date   HGBA1C 5.4 09/10/2019   Lab Results  Component Value Date   INSULIN 19.8 09/10/2019   Lab Results  Component Value Date   TSH 0.85 05/01/2019   Lab Results  Component Value Date   CHOL 157 05/01/2019   HDL 60 05/01/2019   LDLCALC 86 05/01/2019   TRIG 55 05/01/2019   CHOLHDL 3 09/08/2017   Lab Results  Component Value Date   WBC 5.1 08/01/2019   HGB 11.7 (A) 08/01/2019   HCT 36 08/01/2019   MCV 87.1 09/08/2017   PLT 266 08/01/2019   Lab Results  Component Value Date  IRON 7 08/01/2019   TIBC 34 08/01/2019   Attestation Statements:   Reviewed by clinician on day of visit: allergies, medications, problem list, medical history, surgical history, family history, social history, and previous encounter notes.  Time spent on visit including pre-visit chart review and post-visit care and charting was 30 minutes.    I, Trixie Dredge, am acting as transcriptionist for Dennard Nip, MD.  I have reviewed the above documentation for accuracy and completeness, and I agree with the above. -  Dennard Nip, MD

## 2019-12-25 ENCOUNTER — Ambulatory Visit (INDEPENDENT_AMBULATORY_CARE_PROVIDER_SITE_OTHER): Payer: 59 | Admitting: Family Medicine

## 2019-12-26 ENCOUNTER — Other Ambulatory Visit: Payer: Self-pay

## 2019-12-26 ENCOUNTER — Encounter (INDEPENDENT_AMBULATORY_CARE_PROVIDER_SITE_OTHER): Payer: Self-pay | Admitting: Family Medicine

## 2019-12-26 ENCOUNTER — Ambulatory Visit (INDEPENDENT_AMBULATORY_CARE_PROVIDER_SITE_OTHER): Payer: 59 | Admitting: Family Medicine

## 2019-12-26 VITALS — BP 125/83 | HR 60 | Temp 98.4°F | Ht 61.0 in | Wt 149.0 lb

## 2019-12-26 DIAGNOSIS — E669 Obesity, unspecified: Secondary | ICD-10-CM

## 2019-12-26 DIAGNOSIS — R632 Polyphagia: Secondary | ICD-10-CM | POA: Diagnosis not present

## 2019-12-26 DIAGNOSIS — Z9189 Other specified personal risk factors, not elsewhere classified: Secondary | ICD-10-CM | POA: Diagnosis not present

## 2019-12-26 DIAGNOSIS — E8881 Metabolic syndrome: Secondary | ICD-10-CM

## 2019-12-26 DIAGNOSIS — Z6830 Body mass index (BMI) 30.0-30.9, adult: Secondary | ICD-10-CM

## 2019-12-26 DIAGNOSIS — E88819 Insulin resistance, unspecified: Secondary | ICD-10-CM

## 2019-12-26 MED ORDER — TOPIRAMATE 25 MG PO TABS: 25.0000 mg | ORAL_TABLET | Freq: Every day | ORAL | 0 refills | Status: DC

## 2019-12-30 NOTE — Progress Notes (Signed)
Chief Complaint:   OBESITY Lindsay [name] is here to discuss her progress with her obesity treatment plan along with follow-up of her obesity related diagnoses. Lindsay [name] is on the Category 1 Plan and states she is following her eating plan approximately 50% of the time. Lindsay [name] states she is exercising for 0 minutes 0 times per week.  Today's visit was #: 7 Starting weight: 163 lbs Starting date: 08/27/2019 Today's weight: 149 lbs Today's date: 12/26/2019 Total lbs lost to date: 14 lbs Total lbs lost since last in-office visit: 1 lb  Interim History: Lindsay [name] says that she eats dinner around 5-6 pm; sometimes creeping to 7 pm.  She reports that she craves sweets after dinner.  She says that she did not start the Wellbutrin after her last visit.  She says that she is unable to remember to take it at 10 am.  Subjective:   1. Insulin resistance Lindsay [name] has a diagnosis of insulin resistance based on her elevated fasting insulin level >5. She continues to work on diet and exercise to decrease her risk of diabetes.  Lab Results  Component Value Date   INSULIN 19.8 09/10/2019   Lab Results  Component Value Date   HGBA1C 5.4 09/10/2019   2. Polyphagia at night Annalena endorses excessive hunger.   3. At risk for side effect of medication Lindsay [name] is at risk for side effects due to starting a new medication.  Assessment/Plan:   1. Insulin resistance [name] will continue to work on weight loss, exercise, and decreasing simple carbohydrates to help decrease the risk of diabetes. [name] agreed to follow-up with [place] as directed to closely monitor her progress.  2. Polyphagia Intensive lifestyle modifications are the first line treatment for this issue. We discussed several lifestyle modifications today and she will continue to work on diet, exercise and weight loss efforts. Orders and follow up as documented in patient record.  Counseling . Polyphagia is excessive hunger. . Causes can include: low  blood sugars, hypERthyroidism, PMS, lack of sleep, stress, insulin resistance, diabetes, certain medications, and diets that are deficient in protein and fiber.   Orders - topiramate (TOPAMAX) 25 MG tablet; Take 1 tablet (25 mg total) by mouth daily. At dinner  Dispense: 30 tablet; Refill: 0  3. At risk for side effect of medication Lindsay [name] was given approximately 15 minutes of drug side effect counseling today.  We discussed side effect possibility and risk versus benefits. Lindsay [name] agreed to the medication and will contact this office if these side effects are intolerable.  Repetitive spaced learning was employed today to elicit superior memory formation and behavioral change.  4. Class 1 obesity with serious comorbidity and body mass index (BMI) of 30.0 to 30.9 in adult, unspecified obesity type Lindsay [name] is currently in the action stage of change. As such, her goal is to continue with weight loss efforts. She has agreed to the Category 1 Plan.   Exercise goals: For substantial health benefits, adults should do at least 150 minutes (2 hours and 30 minutes) a week of moderate-intensity, or 75 minutes (1 hour and 15 minutes) a week of vigorous-intensity aerobic physical activity, or an equivalent combination of moderate- and vigorous-intensity aerobic activity. Aerobic activity should be performed in episodes of at least 10 minutes, and preferably, it should be spread throughout the week.  Behavioral modification strategies: increasing lean protein intake and increasing water intake.  Lindsay [name] has agreed to follow-up with our clinic in 3 weeks. She was informed of the importance  of frequent follow-up visits to maximize her success with intensive lifestyle modifications for her multiple health conditions.   Objective:   Blood pressure 125/83, pulse 60, temperature 98.4 F (36.9 C), temperature source Oral, height 5\' 1"  (1.549 m), weight 149 lb (67.6 kg), SpO2 100 %. Body mass index is 28.15  kg/m.  General: Cooperative, alert, well developed, in no acute distress. HEENT: Conjunctivae and lids unremarkable. Cardiovascular: Regular rhythm.  Lungs: Normal work of breathing. Neurologic: No focal deficits.   Lab Results  Component Value Date   CREATININE 0.9 05/01/2019   BUN 10 05/01/2019   NA 138 05/01/2019   K 3.7 05/01/2019   CL 101 05/01/2019   CO2 26 (A) 05/01/2019   Lab Results  Component Value Date   ALT 13 05/01/2019   AST 15 08/01/2019   ALKPHOS 46 09/08/2017   BILITOT 0.4 09/08/2017   Lab Results  Component Value Date   HGBA1C 5.4 09/10/2019   Lab Results  Component Value Date   INSULIN 19.8 09/10/2019   Lab Results  Component Value Date   TSH 0.85 05/01/2019   Lab Results  Component Value Date   CHOL 157 05/01/2019   HDL 60 05/01/2019   LDLCALC 86 05/01/2019   TRIG 55 05/01/2019   CHOLHDL 3 09/08/2017   Lab Results  Component Value Date   WBC 5.1 08/01/2019   HGB 11.7 (A) 08/01/2019   HCT 36 08/01/2019   MCV 87.1 09/08/2017   PLT 266 08/01/2019   Lab Results  Component Value Date   IRON 7 08/01/2019   TIBC 34 08/01/2019   Attestation Statements:   Reviewed by clinician on day of visit: allergies, medications, problem list, medical history, surgical history, family history, social history, and previous encounter notes.  I, Water quality scientist, CMA, am acting as transcriptionist for Briscoe Deutscher, DO  I have reviewed the above documentation for accuracy and completeness, and I agree with the above. Briscoe Deutscher, DO

## 2020-01-15 ENCOUNTER — Ambulatory Visit (INDEPENDENT_AMBULATORY_CARE_PROVIDER_SITE_OTHER): Payer: 59 | Admitting: Family Medicine

## 2020-01-15 ENCOUNTER — Other Ambulatory Visit: Payer: Self-pay

## 2020-01-15 ENCOUNTER — Encounter (INDEPENDENT_AMBULATORY_CARE_PROVIDER_SITE_OTHER): Payer: Self-pay | Admitting: Family Medicine

## 2020-01-15 VITALS — BP 103/70 | HR 71 | Temp 98.7°F | Ht 61.0 in | Wt 151.0 lb

## 2020-01-15 DIAGNOSIS — Z6830 Body mass index (BMI) 30.0-30.9, adult: Secondary | ICD-10-CM

## 2020-01-15 DIAGNOSIS — Z9189 Other specified personal risk factors, not elsewhere classified: Secondary | ICD-10-CM | POA: Diagnosis not present

## 2020-01-15 DIAGNOSIS — E669 Obesity, unspecified: Secondary | ICD-10-CM | POA: Diagnosis not present

## 2020-01-15 DIAGNOSIS — I10 Essential (primary) hypertension: Secondary | ICD-10-CM

## 2020-01-15 NOTE — Progress Notes (Signed)
Chief Complaint:   OBESITY Lindsay [name] is here to discuss her progress with her obesity treatment plan along with follow-up of her obesity related diagnoses. Lindsay [name] is on the Category 1 Plan and states she is following her eating plan approximately 65-70% of the time. Lindsay [name] states she is exercising for 0 minutes 0 times per week.  Today's visit was #: 8 Starting weight: 163 lbs Starting date: 08/27/2019 Today's weight: 151 lbs Today's date: 01/15/2020 Total lbs lost to date: 12 lbs Total lbs lost since last in-office visit: 0  Interim History: Lindsay [name] endorses increased hunger when she gets off work (after 1 pm).  She is eating on plan for breakfast/lunch.  Subjective:   1. Essential hypertension Review: taking medications as instructed, no medication side effects noted, no chest pain on exertion, no dyspnea on exertion, no swelling of ankles.   BP Readings from Last 3 Encounters:  01/15/20 103/70  12/26/19 125/83  12/04/19 116/76   2. At risk for heart disease Lindsay [name] is at a higher than average risk for cardiovascular disease due to obesity.   Assessment/Plan:   1. Essential hypertension Lindsay [name] is working on healthy weight loss and exercise to improve blood pressure control. We will watch for signs of hypotension as she continues her lifestyle modifications.  2. At risk for heart disease Lindsay [name] was given approximately 15 minutes of coronary artery disease prevention counseling today. She is 49 y.o. female and has risk factors for heart disease including obesity. We discussed intensive lifestyle modifications today with an emphasis on specific weight loss instructions and strategies.   Repetitive spaced learning was employed today to elicit superior memory formation and behavioral change.  3. Class 1 obesity with serious comorbidity and body mass index (BMI) of 30.0 to 30.9 in adult, unspecified obesity type Lindsay [name] is currently in the action stage of change. As such, her goal  is to continue with weight loss efforts. She has agreed to the Category 1 Plan.   Exercise goals: Call the [facility].  Sofa exercises.  Learn about HIIT.  Behavioral modification strategies: increasing lean protein intake, emotional eating strategies and planning for success.  Lindsay [name] has agreed to follow-up with our clinic in 2-3 weeks. She was informed of the importance of frequent follow-up visits to maximize her success with intensive lifestyle modifications for her multiple health conditions.   Objective:   Blood pressure 103/70, pulse 71, temperature 98.7 F (37.1 C), temperature source Oral, height 5\' 1"  (1.549 m), weight 151 lb (68.5 kg), last menstrual period 12/31/2019, SpO2 99 %. Body mass index is 28.53 kg/m.  General: Cooperative, alert, well developed, in no acute distress. HEENT: Conjunctivae and lids unremarkable. Cardiovascular: Regular rhythm.  Lungs: Normal work of breathing. Neurologic: No focal deficits.   Lab Results  Component Value Date   CREATININE 0.9 05/01/2019   BUN 10 05/01/2019   NA 138 05/01/2019   K 3.7 05/01/2019   CL 101 05/01/2019   CO2 26 (A) 05/01/2019   Lab Results  Component Value Date   ALT 13 05/01/2019   AST 15 08/01/2019   ALKPHOS 46 09/08/2017   BILITOT 0.4 09/08/2017   Lab Results  Component Value Date   HGBA1C 5.4 09/10/2019   Lab Results  Component Value Date   INSULIN 19.8 09/10/2019   Lab Results  Component Value Date   TSH 0.85 05/01/2019   Lab Results  Component Value Date   CHOL 157 05/01/2019   HDL 60 05/01/2019   LDLCALC 86  05/01/2019   TRIG 55 05/01/2019   CHOLHDL 3 09/08/2017   Lab Results  Component Value Date   WBC 5.1 08/01/2019   HGB 11.7 (A) 08/01/2019   HCT 36 08/01/2019   MCV 87.1 09/08/2017   PLT 266 08/01/2019   Lab Results  Component Value Date   IRON 7 08/01/2019   TIBC 34 08/01/2019   Attestation Statements:   Reviewed by clinician on day of visit: allergies, medications, problem  list, medical history, surgical history, family history, social history, and previous encounter notes.  I, Insurance claims handler, CMA, am acting as transcriptionist for Helane Rima, DO  I have reviewed the above documentation for accuracy and completeness, and I agree with the above. Helane Rima, DO

## 2020-02-05 ENCOUNTER — Other Ambulatory Visit: Payer: Self-pay

## 2020-02-05 ENCOUNTER — Ambulatory Visit (INDEPENDENT_AMBULATORY_CARE_PROVIDER_SITE_OTHER): Payer: 59 | Admitting: Family Medicine

## 2020-02-05 ENCOUNTER — Encounter (INDEPENDENT_AMBULATORY_CARE_PROVIDER_SITE_OTHER): Payer: Self-pay | Admitting: Family Medicine

## 2020-02-05 VITALS — BP 115/78 | HR 68 | Temp 98.1°F | Ht 61.0 in | Wt 151.0 lb

## 2020-02-05 DIAGNOSIS — F3289 Other specified depressive episodes: Secondary | ICD-10-CM

## 2020-02-05 DIAGNOSIS — I10 Essential (primary) hypertension: Secondary | ICD-10-CM | POA: Diagnosis not present

## 2020-02-05 DIAGNOSIS — Z9189 Other specified personal risk factors, not elsewhere classified: Secondary | ICD-10-CM

## 2020-02-05 DIAGNOSIS — E669 Obesity, unspecified: Secondary | ICD-10-CM

## 2020-02-05 DIAGNOSIS — Z6830 Body mass index (BMI) 30.0-30.9, adult: Secondary | ICD-10-CM

## 2020-02-06 MED ORDER — WEGOVY 0.25 MG/0.5ML ~~LOC~~ SOAJ: 0.2500 mg | SUBCUTANEOUS | 0 refills | Status: DC

## 2020-02-06 NOTE — Progress Notes (Signed)
Chief Complaint:   OBESITY Lindsay [name] is here to discuss her progress with her obesity treatment plan along with follow-up of her obesity related diagnoses. Lindsay [name] is on the Category 1 Plan and states she is following her eating plan approximately 60% of the time. Hanako states she has increased her walking.  Today's visit was #: 9 Starting weight: 163 lbs Starting date: 08/27/2019 Today's weight: 151 lbs Today's date: 02/05/2020 Total lbs lost to date: 12 lbs Total lbs lost since last in-office visit: 0  Interim History: Lindsay [name] says she has not been sleeping enough (around 6 hours).  Breakfast has been okay.  If she forgets lunch, she eats at school.    Subjective:   1. Essential hypertension Review: taking medications as instructed, no medication side effects noted, no chest pain on exertion, no dyspnea on exertion, no swelling of ankles.  She is taking Benicar HCT for blood pressure control.  BP Readings from Last 3 Encounters:  02/05/20 115/78  01/15/20 103/70  12/26/19 125/83   2. Other depression with emotional eating Lindsay [name] is struggling with emotional eating and using food for comfort to the extent that it is negatively impacting her health. She has been working on behavior modification techniques to help reduce her emotional eating and has been successful. She shows no sign of suicidal or homicidal ideations.  She is taking Wellbutrin 200 mg daily and Topamax 25 mg daily.  3. At risk for constipation Lindsay [name] is at increased risk for constipation due to inadequate water intake, changes in diet, and/or use of medications such as GLP1 agonists. Lindsay [name] denies hard, infrequent stools currently.   Assessment/Plan:   1. Essential hypertension Lora is working on healthy weight loss and exercise to improve blood pressure control. We will watch for signs of hypotension as she continues her lifestyle modifications.  2. Other depression with emotional eating Behavior modification  techniques were discussed today to help Emiley deal with her emotional/non-hunger eating behaviors.  Orders and follow up as documented in patient record.   3. At risk for constipation Lindsay [name] was given approximately 15 minutes of counseling today regarding prevention of constipation. She was encouraged to increase water and fiber intake.   4. Class 1 obesity with serious comorbidity and body mass index (BMI) of 30.0 to 30.9 in adult, unspecified obesity type  - Semaglutide-Weight Management (WEGOVY) 0.25 MG/0.5ML SOAJ; Inject 0.25 mg into the skin once a week.  Dispense: 2 mL; Refill: 0  Lindsay [name] is currently in the action stage of change. As such, her goal is to continue with weight loss efforts. She has agreed to the Category 1 Plan.   Exercise goals: For substantial health benefits, adults should do at least 150 minutes (2 hours and 30 minutes) a week of moderate-intensity, or 75 minutes (1 hour and 15 minutes) a week of vigorous-intensity aerobic physical activity, or an equivalent combination of moderate- and vigorous-intensity aerobic activity. Aerobic activity should be performed in episodes of at least 10 minutes, and preferably, it should be spread throughout the week.  Behavioral modification strategies: We discussed [email or web address], increasing water intake to 85 ounces per day, and sleeping 7-8 hours per night.  Lindsay [name] has agreed to follow-up with our clinic in 3 weeks. She was informed of the importance of frequent follow-up visits to maximize her success with intensive lifestyle modifications for her multiple health conditions.   Objective:   Blood pressure 115/78, pulse 68, temperature 98.1 F (36.7 C), temperature source Oral, height 5\' 1"  (1.549  m), weight 151 lb (68.5 kg), SpO2 100 %. Body mass index is 28.53 kg/m.  General: Cooperative, alert, well developed, in no acute distress. HEENT: Conjunctivae and lids unremarkable. Cardiovascular: Regular rhythm.  Lungs: Normal work  of breathing. Neurologic: No focal deficits.   Lab Results  Component Value Date   CREATININE 0.9 05/01/2019   BUN 10 05/01/2019   NA 138 05/01/2019   K 3.7 05/01/2019   CL 101 05/01/2019   CO2 26 (A) 05/01/2019   Lab Results  Component Value Date   ALT 13 05/01/2019   AST 15 08/01/2019   ALKPHOS 46 09/08/2017   BILITOT 0.4 09/08/2017   Lab Results  Component Value Date   HGBA1C 5.4 09/10/2019   Lab Results  Component Value Date   INSULIN 19.8 09/10/2019   Lab Results  Component Value Date   TSH 0.85 05/01/2019   Lab Results  Component Value Date   CHOL 157 05/01/2019   HDL 60 05/01/2019   LDLCALC 86 05/01/2019   TRIG 55 05/01/2019   CHOLHDL 3 09/08/2017   Lab Results  Component Value Date   WBC 5.1 08/01/2019   HGB 11.7 (A) 08/01/2019   HCT 36 08/01/2019   MCV 87.1 09/08/2017   PLT 266 08/01/2019   Lab Results  Component Value Date   IRON 7 08/01/2019   TIBC 34 08/01/2019   Attestation Statements:   Reviewed by clinician on day of visit: allergies, medications, problem list, medical history, surgical history, family history, social history, and previous encounter notes.  I, Insurance claims handler, CMA, am acting as transcriptionist for Helane Rima, DO  I have reviewed the above documentation for accuracy and completeness, and I agree with the above. Helane Rima, DO

## 2020-02-26 ENCOUNTER — Ambulatory Visit (INDEPENDENT_AMBULATORY_CARE_PROVIDER_SITE_OTHER): Payer: 59 | Admitting: Family Medicine

## 2020-03-06 ENCOUNTER — Encounter: Payer: Self-pay | Admitting: Oncology

## 2020-03-06 ENCOUNTER — Other Ambulatory Visit (HOSPITAL_COMMUNITY): Payer: Self-pay | Admitting: Oncology

## 2020-03-06 DIAGNOSIS — U071 COVID-19: Secondary | ICD-10-CM

## 2020-03-06 NOTE — Progress Notes (Signed)
I connected by phone with  Lindsay [name] at 320 pm to discuss the potential use of an new treatment for mild to moderate COVID-19 viral infection in non-hospitalized patients.   This patient is a age/sex that meets the FDA criteria for Emergency Use Authorization of casirivimab\imdevimab.  Has a (+) direct SARS-CoV-2 viral test result 1. Has mild or moderate COVID-19  2. Is ? 48 years of age and weighs ? 40 kg 3. Is NOT hospitalized due to COVID-19 4. Is NOT requiring oxygen therapy or requiring an increase in baseline oxygen flow rate due to COVID-19 5. Is within 10 days of symptom onset 6. Has at least one of the high risk factor(s) for progression to severe COVID-19 and/or hospitalization as defined in EUA. ? Specific high risk criteria : Obesity, HTN, CAD   Symptom onset  02/28/20.   I have spoken and communicated the following to the patient or parent/caregiver:   1. FDA has authorized the emergency use of casirivimab\imdevimab for the treatment of mild to moderate COVID-19 in adults and pediatric patients with positive results of direct SARS-CoV-2 viral testing who are 14 years of age and older weighing at least 40 kg, and who are at high risk for progressing to severe COVID-19 and/or hospitalization.   2. The significant known and potential risks and benefits of casirivimab\imdevimab, and the extent to which such potential risks and benefits are unknown.   3. Information on available alternative treatments and the risks and benefits of those alternatives, including clinical trials.   4. Patients treated with casirivimab\imdevimab should continue to self-isolate and use infection control measures (e.g., wear mask, isolate, social distance, avoid sharing personal items, clean and disinfect "high touch" surfaces, and frequent handwashing) according to CDC guidelines.    5. The patient or parent/caregiver has the option to accept or refuse casirivimab\imdevimab .   After reviewing this  information with the patient, The patient agreed to proceed with receiving casirivimab\imdevimab infusion and will be provided a copy of the Fact sheet prior to receiving the infusion.Mignon Pine, AGNP-C [phone] (Infusion Center Hotline)

## 2020-03-07 ENCOUNTER — Ambulatory Visit (HOSPITAL_COMMUNITY)
Admission: RE | Admit: 2020-03-07 | Discharge: 2020-03-07 | Disposition: A | Discharge status: Home / Self Care | Payer: 59 | Source: Ambulatory Visit | Attending: Pulmonary Disease | Admitting: Pulmonary Disease

## 2020-03-07 DIAGNOSIS — U071 COVID-19: Secondary | ICD-10-CM | POA: Insufficient documentation

## 2020-03-07 DIAGNOSIS — I10 Essential (primary) hypertension: Secondary | ICD-10-CM | POA: Diagnosis not present

## 2020-03-07 MED ORDER — SODIUM CHLORIDE 0.9 % IV SOLN
1200.0000 mg | Freq: Once | INTRAVENOUS | Status: AC
  Administered 2020-03-07: 1200 mg via INTRAVENOUS
  Filled 2020-03-07: qty 10

## 2020-03-07 MED ORDER — EPINEPHRINE 0.3 MG/0.3ML IJ SOAJ: 0.3000 mg | Freq: Once | INTRAMUSCULAR | Status: DC | PRN

## 2020-03-07 MED ORDER — ALBUTEROL SULFATE HFA 108 (90 BASE) MCG/ACT IN AERS: 2.0000 | INHALATION_SPRAY | Freq: Once | RESPIRATORY_TRACT | Status: DC | PRN

## 2020-03-07 MED ORDER — FAMOTIDINE IN NACL 20-0.9 MG/50ML-% IV SOLN: 20.0000 mg | Freq: Once | INTRAVENOUS | Status: DC | PRN

## 2020-03-07 MED ORDER — DIPHENHYDRAMINE HCL 50 MG/ML IJ SOLN: 50.0000 mg | Freq: Once | INTRAMUSCULAR | Status: DC | PRN

## 2020-03-07 MED ORDER — METHYLPREDNISOLONE SODIUM SUCC 125 MG IJ SOLR: 125.0000 mg | Freq: Once | INTRAMUSCULAR | Status: DC | PRN

## 2020-03-07 MED ORDER — SODIUM CHLORIDE 0.9 % IV SOLN: INTRAVENOUS | Status: DC | PRN

## 2020-03-07 NOTE — Progress Notes (Signed)
  Diagnosis: COVID-19  Physician:Dr. Patrick Wright  Procedure: [facility] Med: casirivimab\imdevimab infusion - Provided patient with casirivimab\imdevimab fact sheet for patients, parents and caregivers prior to infusion.  Complications: No immediate complications noted.  Discharge: Discharged home   Sh[place] Lynn Jeneane Pieczynski 03/07/2020   

## 2020-03-07 NOTE — Discharge Instructions (Signed)

## 2021-05-25 ENCOUNTER — Encounter: Payer: Self-pay | Admitting: Gastroenterology

## 2021-06-07 ENCOUNTER — Ambulatory Visit (AMBULATORY_SURGERY_CENTER): Payer: Self-pay

## 2021-06-07 ENCOUNTER — Other Ambulatory Visit: Payer: Self-pay

## 2021-06-07 VITALS — Ht 60.0 in | Wt 128.0 lb

## 2021-06-07 DIAGNOSIS — Z1211 Encounter for screening for malignant neoplasm of colon: Secondary | ICD-10-CM

## 2021-06-07 NOTE — Progress Notes (Signed)

## 2021-06-08 ENCOUNTER — Telehealth: Payer: Self-pay | Admitting: Gastroenterology

## 2021-06-08 DIAGNOSIS — Z1211 Encounter for screening for malignant neoplasm of colon: Secondary | ICD-10-CM

## 2021-06-08 MED ORDER — PEG 3350-KCL-NA BICARB-NACL 420 G PO SOLR: 4000.0000 mL | Freq: Once | ORAL | 0 refills | Status: AC

## 2021-06-08 NOTE — Telephone Encounter (Signed)
Inbound call from Patient, returning your call.

## 2021-06-08 NOTE — Telephone Encounter (Signed)
Inbound call from patient states she spoke with her insurance:  227.1oz powered golytely is covered 100% no authorization needed.   236oz golytely bottle is covered 100% proir authorization is needed.

## 2021-06-08 NOTE — Telephone Encounter (Signed)
Spoke with pt who requests Golytely, as it is covered by her insurance.  Golytely sent to RX and new instructions sent via MyChart

## 2021-06-08 NOTE — Telephone Encounter (Signed)
LMOM to call office back re: prep

## 2021-06-22 ENCOUNTER — Telehealth: Payer: Self-pay | Admitting: Gastroenterology

## 2021-06-22 NOTE — Telephone Encounter (Signed)
Inbound call from patient, states that she has some questions about her prep. Please advise.

## 2021-06-22 NOTE — Telephone Encounter (Signed)
Spoke with the patient. Answered her prep questions. Patient had beans and nuts on Sunday but none since then. Ok to proceed.

## 2021-06-22 NOTE — Telephone Encounter (Signed)
Can you please send to previsit?  Thank you

## 2021-06-23 ENCOUNTER — Ambulatory Visit (AMBULATORY_SURGERY_CENTER): Payer: 59 | Admitting: Gastroenterology

## 2021-06-23 ENCOUNTER — Other Ambulatory Visit: Payer: Self-pay

## 2021-06-23 ENCOUNTER — Encounter: Payer: Self-pay | Admitting: Gastroenterology

## 2021-06-23 VITALS — BP 136/85 | HR 60 | Temp 97.8°F | Resp 11 | Ht 60.0 in | Wt 128.0 lb

## 2021-06-23 DIAGNOSIS — Z1211 Encounter for screening for malignant neoplasm of colon: Secondary | ICD-10-CM | POA: Diagnosis not present

## 2021-06-23 MED ORDER — SODIUM CHLORIDE 0.9 % IV SOLN: 500.0000 mL | Freq: Once | INTRAVENOUS | Status: DC

## 2021-06-23 NOTE — Op Note (Signed)
[place] Patient Name: Lindsay [name] Procedure Date: 06/23/2021 11:19 AM MRN: [id number] Endoscopist: Justice Britain , MD Age: 50 Referring MD:  Date of Birth: 1971/07/04 Gender: Female Account #: [id number] Procedure:                Colonoscopy Indications:              Screening for colorectal malignant neoplasm, This                            is the patient's first colonoscopy Medicines:                Monitored Anesthesia Care Procedure:                Pre-Anesthesia Assessment:                           - Prior to the procedure, a History and Physical                            was performed, and patient medications and                            allergies were reviewed. The patient's tolerance of                            previous anesthesia was also reviewed. The risks                            and benefits of the procedure and the sedation                            options and risks were discussed with the patient.                            All questions were answered, and informed consent                            was obtained. Prior Anticoagulants: The patient has                            taken no previous anticoagulant or antiplatelet                            agents. ASA Grade Assessment: II - A patient with                            mild systemic disease. After reviewing the risks                            and benefits, the patient was deemed in                            satisfactory condition to undergo the procedure.  After obtaining informed consent, the colonoscope                            was passed under direct vision. Throughout the                            procedure, the patient's blood pressure, pulse, and                            oxygen saturations were monitored continuously. The                            [id number]-HQ190L Colonoscope was introduced through the                            anus and advanced  to the 5 cm into the ileum. The                            colonoscopy was performed without difficulty. The                            patient tolerated the procedure. The quality of the                            bowel preparation was good. The terminal ileum,                            ileocecal valve, appendiceal orifice, and rectum                            were photographed. Scope In: 11:27:23 AM Scope Out: 11:45:59 AM Scope Withdrawal Time: 0 hours 13 minutes 0 seconds  Total Procedure Duration: 0 hours 18 minutes 36 seconds  Findings:                 The digital rectal exam was normal. Pertinent                            negatives include no palpable rectal lesions.                           The terminal ileum and ileocecal valve appeared                            normal.                           Normal mucosa was found in the entire colon.                           Non-bleeding non-thrombosed internal hemorrhoids                            were found during retroflexion and during  endoscopy. The hemorrhoids were Grade I (internal                            hemorrhoids that do not prolapse). Complications:            No immediate complications. Estimated Blood Loss:     Estimated blood loss was minimal. Estimated blood                            loss: none. Impression:               - The examined portion of the ileum was normal.                           - Normal mucosa in the entire examined colon.                           - Non-bleeding non-thrombosed internal hemorrhoids. Recommendation:           - The patient will be observed post-procedure,                            until all discharge criteria are met.                           - Discharge patient to home.                           - Patient has a contact number available for                            emergencies. The signs and symptoms of potential                            delayed  complications were discussed with the                            patient. Return to normal activities tomorrow.                            Written discharge instructions were provided to the                            patient.                           - High fiber diet.                           - Use FiberCon 1-2 tablets PO daily.                           - Continue present medications.                           - Repeat colonoscopy in 10 years for screening  purposes.                           - The findings and recommendations were discussed                            with the patient.                           - The findings and recommendations were discussed                            with the patient's family. Justice Britain, MD 06/23/2021 11:51:18 AM

## 2021-06-23 NOTE — Progress Notes (Signed)
PT taken to PACU. Monitors in place. VSS. Report given to RN. 

## 2021-06-23 NOTE — Progress Notes (Signed)
GASTROENTEROLOGY PROCEDURE H&P NOTE   Primary Care Physician: Adrienne Mocha, PA (Inactive)  HPI: Lindsay [name] is a 50 y.o. female who presents for Colonoscopy for screening.  Past Medical History:  Diagnosis Date   ADD (attention deficit disorder)    Back pain    Bursitis, hip    Edema, lower extremity    Gallbladder disease    Hypertension    on meds   Iron deficiency anemia    on meds   Lactose intolerance    Multiple food allergies    Seasonal allergies    Vitamin D deficiency    Past Surgical History:  Procedure Laterality Date   APPENDECTOMY     "when I was a child"   CHOLECYSTECTOMY N/A 02/12/2016   Procedure: LAPAROSCOPIC CHOLECYSTECTOMY WITH INTRAOPERATIVE CHOLANGIOGRAM;  Surgeon: Manus Rudd, MD;  Location: [facility] OR;  Service: General;  Laterality: N/A;   ECTOPIC PREGNANCY SURGERY  1998   TUBAL LIGATION  2009   Current Outpatient Medications  Medication Sig Dispense Refill   ferrous sulfate 325 (65 FE) MG tablet Take 325 mg by mouth daily with breakfast.     fluticasone (FLONASE) 50 MCG/ACT nasal spray Place 2 sprays into both nostrils daily. (Patient taking differently: Place 2 sprays into both nostrils daily as needed.) 16 g 6   ipratropium (ATROVENT) 0.06 % nasal spray Place 2 sprays into both nostrils 4 (four) times daily as needed.     Multiple Vitamin (MULTIVITAMIN) tablet Take 1 tablet by mouth daily.     olmesartan-hydrochlorothiazide (BENICAR HCT) 20-12.5 MG tablet Take 1 tablet by mouth daily.     No current facility-administered medications for this visit.    Current Outpatient Medications:    ferrous sulfate 325 (65 FE) MG tablet, Take 325 mg by mouth daily with breakfast., Disp: , Rfl:    fluticasone (FLONASE) 50 MCG/ACT nasal spray, Place 2 sprays into both nostrils daily. (Patient taking differently: Place 2 sprays into both nostrils daily as needed.), Disp: 16 g, Rfl: 6   ipratropium (ATROVENT) 0.06 % nasal spray, Place 2 sprays into  both nostrils 4 (four) times daily as needed., Disp: , Rfl:    Multiple Vitamin (MULTIVITAMIN) tablet, Take 1 tablet by mouth daily., Disp: , Rfl:    olmesartan-hydrochlorothiazide (BENICAR HCT) 20-12.5 MG tablet, Take 1 tablet by mouth daily., Disp: , Rfl:  Allergies  Allergen Reactions   Ibuprofen Hypertension   Other Anaphylaxis, Hives and Rash    EGGPLANT   Family History  Problem Relation Age of Onset   Lung cancer Mother        Mother was a smoker   Hypertension Father    Hyperlipidemia Father    Alcohol abuse Father    Diabetes Mellitus II Maternal Uncle    Colon polyps Neg Hx    Colon cancer Neg Hx    Esophageal cancer Neg Hx    Rectal cancer Neg Hx    Stomach cancer Neg Hx    Social History   Socioeconomic History   Marital status: Married    Spouse name: Not on file   Number of children: Not on file   Years of education: Not on file   Highest education level: Not on file  Occupational History   Occupation: school and nutrition  Tobacco Use   Smoking status: Never   Smokeless tobacco: Never  Vaping Use   Vaping Use: Never used  Substance and Sexual Activity   Alcohol use: No    Alcohol/week:  0.0 standard drinks   Drug use: No   Sexual activity: Yes    Birth control/protection: None  Other Topics Concern   Not on file  Social History Narrative   Homemaker   6 children   Social Determinants of Health   Financial Resource Strain: Not on file  Food Insecurity: Not on file  Transportation Needs: Not on file  Physical Activity: Not on file  Stress: Not on file  Social Connections: Not on file  Intimate Partner Violence: Not on file    Physical Exam: There were no vitals filed for this visit. There is no height or weight on file to calculate BMI. GEN: NAD EYE: Sclerae anicteric ENT: MMM CV: Non-tachycardic GI: Soft, NT/ND NEURO:  Alert & Oriented x 3  Lab Results: No results for input(s): WBC, HGB, HCT, PLT in the last 72 hours. BMET No  results for input(s): NA, K, CL, CO2, GLUCOSE, BUN, CREATININE, CALCIUM in the last 72 hours. LFT No results for input(s): PROT, ALBUMIN, AST, ALT, ALKPHOS, BILITOT, BILIDIR, IBILI in the last 72 hours. PT/INR No results for input(s): LABPROT, INR in the last 72 hours.   Impression / Plan: This is a 50 y.o.female who presents for Colonoscopy for screening.  The risks and benefits of endoscopic evaluation/treatment were discussed with the patient and/or family; these include but are not limited to the risk of perforation, infection, bleeding, missed lesions, lack of diagnosis, severe illness requiring hospitalization, as well as anesthesia and sedation related illnesses.  The patient's history has been reviewed, patient examined, no change in status, and deemed stable for procedure.  The patient and/or family is agreeable to proceed.    Corliss Parish, MD  Gastroenterology Advanced Endoscopy Office # [phone]

## 2021-06-23 NOTE — Patient Instructions (Addendum)
Please read handouts provided. Continue present medications. Await pathology results. High Fiber Diet. Use FiberCon 1-2 tablets daily. Repeat colonoscopy in 10 years for screening.   YOU HAD AN ENDOSCOPIC PROCEDURE TODAY AT THE [facility]:   Refer to the procedure report that was given to you for any specific questions about what was found during the examination.  If the procedure report does not answer your questions, please call your gastroenterologist to clarify.  If you requested that your care partner not be given the details of your procedure findings, then the procedure report has been included in a sealed envelope for you to review at your convenience later.  YOU SHOULD EXPECT: Some feelings of bloating in the abdomen. Passage of more gas than usual.  Walking can help get rid of the air that was put into your GI tract during the procedure and reduce the bloating. If you had a lower endoscopy     SYMPTOMS TO REPORT IMMEDIATELY:  Following lower endoscopy (colonoscopy or flexible sigmoidoscopy):  Excessive amounts of blood in the stool  Significant tenderness or worsening of abdominal pains  Swelling of the abdomen that is new, acute  Fever of 100F or higher   For urgent or emergent issues, a gastroenterologist can be reached at any hour by calling [phone]. Do not use MyChart messaging for urgent concerns.    DIET:  We do recommend a small meal at first, but then you may proceed to your regular diet.  Drink plenty of fluids but you should avoid alcoholic beverages for 24 hours.  ACTIVITY:  You should plan to take it easy for the rest of today and you should NOT DRIVE or use heavy machinery until tomorrow (because of the sedation medicines used during the test).    FOLLOW UP: Our staff will call the number listed on your records 48-72 hours following your procedure to check on you and address any questions or concerns that you may have regarding the  information given to you following your procedure. If we do not reach you, we will leave a message.  We will attempt to reach you two times.  During this call, we will ask if you have developed any symptoms of COVID 19. If you develop any symptoms (ie: fever, flu-like symptoms, shortness of breath, cough etc.) before then, please call [phone].  If you test positive for Covid 19 in the 2 weeks post procedure, please call and report this information to [place].    If any biopsies were taken you will be contacted by phone or by letter within the next 1-3 weeks.  Please call us at [phone] if you have not heard about the biopsies in 3 weeks.    SIGNATURES/CONFIDENTIALITY: You and/or your care partner have signed paperwork which will be entered into your electronic medical record.  These signatures attest to the fact that that the information above on your After Visit Summary has been reviewed and is understood.  Full responsibility of the confidentiality of this discharge information lies with you and/or your care-partner.

## 2021-06-25 ENCOUNTER — Telehealth: Payer: Self-pay | Admitting: *Deleted

## 2021-06-25 ENCOUNTER — Telehealth: Payer: Self-pay

## 2021-06-25 NOTE — Telephone Encounter (Signed)
Attempted f/u call back with no answer, left VM.

## 2021-06-25 NOTE — Telephone Encounter (Signed)
  Follow up Call-  Call back number 06/23/2021  Post procedure Call Back phone  # [phone]  Permission to leave phone message Yes  Some recent data might be hidden     Patient questions: Message left to call us if necessary.

## 2022-02-23 ENCOUNTER — Encounter (INDEPENDENT_AMBULATORY_CARE_PROVIDER_SITE_OTHER): Payer: Self-pay
